# Patient Record
Sex: Female | Born: 1945 | Race: White | Hispanic: No | Marital: Single | Smoking: Former smoker
Health system: Southern US, Community
[De-identification: ages and names within clinical notes are randomized; demographics above are authoritative.]

## PROBLEM LIST (undated history)

## (undated) DIAGNOSIS — K219 Gastro-esophageal reflux disease without esophagitis: Secondary | ICD-10-CM

## (undated) DIAGNOSIS — M797 Fibromyalgia: Secondary | ICD-10-CM

## (undated) DIAGNOSIS — M1990 Unspecified osteoarthritis, unspecified site: Secondary | ICD-10-CM

## (undated) DIAGNOSIS — I10 Essential (primary) hypertension: Secondary | ICD-10-CM

## (undated) DIAGNOSIS — K589 Irritable bowel syndrome without diarrhea: Secondary | ICD-10-CM

---
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
## 2010-12-18 ENCOUNTER — Observation Stay: Payer: Self-pay | Admitting: Internal Medicine

## 2012-06-29 ENCOUNTER — Ambulatory Visit: Payer: Self-pay | Admitting: Family Medicine

## 2012-09-29 ENCOUNTER — Ambulatory Visit: Payer: Self-pay | Admitting: Gastroenterology

## 2012-10-03 LAB — PATHOLOGY REPORT

## 2013-02-05 ENCOUNTER — Ambulatory Visit: Payer: Self-pay | Admitting: Family Medicine

## 2013-02-20 ENCOUNTER — Ambulatory Visit: Payer: Self-pay | Admitting: Family Medicine

## 2013-05-15 ENCOUNTER — Ambulatory Visit: Payer: Self-pay | Admitting: Family Medicine

## 2014-03-18 DIAGNOSIS — J302 Other seasonal allergic rhinitis: Secondary | ICD-10-CM | POA: Insufficient documentation

## 2015-07-29 ENCOUNTER — Other Ambulatory Visit: Payer: Self-pay | Admitting: Family Medicine

## 2015-07-29 DIAGNOSIS — Z1231 Encounter for screening mammogram for malignant neoplasm of breast: Secondary | ICD-10-CM

## 2015-08-13 ENCOUNTER — Ambulatory Visit
Admission: RE | Admit: 2015-08-13 | Discharge: 2015-08-13 | Disposition: A | Discharge status: Home / Self Care | Payer: Medicare Other | Source: Ambulatory Visit | Attending: Family Medicine | Admitting: Family Medicine

## 2015-08-13 DIAGNOSIS — Z1231 Encounter for screening mammogram for malignant neoplasm of breast: Secondary | ICD-10-CM | POA: Insufficient documentation

## 2016-05-10 DIAGNOSIS — M2550 Pain in unspecified joint: Secondary | ICD-10-CM | POA: Insufficient documentation

## 2016-05-10 DIAGNOSIS — R768 Other specified abnormal immunological findings in serum: Secondary | ICD-10-CM | POA: Insufficient documentation

## 2016-06-17 DIAGNOSIS — M7552 Bursitis of left shoulder: Secondary | ICD-10-CM | POA: Insufficient documentation

## 2017-04-20 DIAGNOSIS — E669 Obesity, unspecified: Secondary | ICD-10-CM | POA: Insufficient documentation

## 2017-04-21 ENCOUNTER — Other Ambulatory Visit: Payer: Self-pay | Admitting: Orthopedic Surgery

## 2017-04-21 DIAGNOSIS — M25562 Pain in left knee: Principal | ICD-10-CM

## 2017-04-21 DIAGNOSIS — M2392 Unspecified internal derangement of left knee: Secondary | ICD-10-CM

## 2017-04-21 DIAGNOSIS — G8929 Other chronic pain: Secondary | ICD-10-CM

## 2017-04-21 DIAGNOSIS — M2352 Chronic instability of knee, left knee: Secondary | ICD-10-CM

## 2017-05-04 ENCOUNTER — Ambulatory Visit
Admission: RE | Admit: 2017-05-04 | Discharge: 2017-05-04 | Disposition: A | Discharge status: Home / Self Care | Payer: Medicare Other | Source: Ambulatory Visit | Attending: Orthopedic Surgery | Admitting: Orthopedic Surgery

## 2017-05-04 DIAGNOSIS — M948X6 Other specified disorders of cartilage, lower leg: Secondary | ICD-10-CM | POA: Diagnosis not present

## 2017-05-04 DIAGNOSIS — M25562 Pain in left knee: Secondary | ICD-10-CM | POA: Diagnosis present

## 2017-05-04 DIAGNOSIS — M2352 Chronic instability of knee, left knee: Secondary | ICD-10-CM | POA: Diagnosis not present

## 2017-05-04 DIAGNOSIS — M2392 Unspecified internal derangement of left knee: Secondary | ICD-10-CM | POA: Diagnosis present

## 2017-05-04 DIAGNOSIS — G8929 Other chronic pain: Secondary | ICD-10-CM

## 2017-05-18 DIAGNOSIS — M23204 Derangement of unspecified medial meniscus due to old tear or injury, left knee: Secondary | ICD-10-CM | POA: Insufficient documentation

## 2017-06-01 DIAGNOSIS — M7582 Other shoulder lesions, left shoulder: Secondary | ICD-10-CM | POA: Insufficient documentation

## 2017-06-08 ENCOUNTER — Other Ambulatory Visit: Payer: Self-pay | Admitting: Surgery

## 2017-06-08 DIAGNOSIS — M7582 Other shoulder lesions, left shoulder: Secondary | ICD-10-CM

## 2017-06-15 ENCOUNTER — Ambulatory Visit
Admission: RE | Admit: 2017-06-15 | Discharge: 2017-06-15 | Disposition: A | Discharge status: Home / Self Care | Payer: Medicare Other | Source: Ambulatory Visit | Attending: Surgery | Admitting: Surgery

## 2017-06-15 DIAGNOSIS — M7552 Bursitis of left shoulder: Secondary | ICD-10-CM | POA: Insufficient documentation

## 2017-06-15 DIAGNOSIS — M75102 Unspecified rotator cuff tear or rupture of left shoulder, not specified as traumatic: Secondary | ICD-10-CM | POA: Insufficient documentation

## 2017-06-15 DIAGNOSIS — M7582 Other shoulder lesions, left shoulder: Secondary | ICD-10-CM | POA: Diagnosis present

## 2017-06-22 DIAGNOSIS — M75112 Incomplete rotator cuff tear or rupture of left shoulder, not specified as traumatic: Secondary | ICD-10-CM | POA: Insufficient documentation

## 2017-11-17 ENCOUNTER — Other Ambulatory Visit: Payer: Self-pay | Admitting: Family Medicine

## 2017-11-17 DIAGNOSIS — R1013 Epigastric pain: Secondary | ICD-10-CM

## 2017-11-22 ENCOUNTER — Ambulatory Visit
Admission: RE | Admit: 2017-11-22 | Discharge: 2017-11-22 | Disposition: A | Discharge status: Home / Self Care | Payer: Medicare Other | Source: Ambulatory Visit | Attending: Family Medicine | Admitting: Family Medicine

## 2017-11-22 DIAGNOSIS — K8020 Calculus of gallbladder without cholecystitis without obstruction: Secondary | ICD-10-CM | POA: Insufficient documentation

## 2017-11-22 DIAGNOSIS — R1013 Epigastric pain: Secondary | ICD-10-CM | POA: Insufficient documentation

## 2017-12-07 ENCOUNTER — Other Ambulatory Visit: Payer: Self-pay | Admitting: Surgery

## 2017-12-07 DIAGNOSIS — K838 Other specified diseases of biliary tract: Secondary | ICD-10-CM

## 2017-12-17 ENCOUNTER — Other Ambulatory Visit: Payer: Self-pay | Admitting: Surgery

## 2017-12-17 ENCOUNTER — Ambulatory Visit: Payer: Medicare Other

## 2017-12-17 ENCOUNTER — Ambulatory Visit
Admission: RE | Admit: 2017-12-17 | Discharge: 2017-12-17 | Disposition: A | Discharge status: Home / Self Care | Payer: Medicare Other | Source: Ambulatory Visit | Attending: Surgery | Admitting: Surgery

## 2017-12-17 DIAGNOSIS — K838 Other specified diseases of biliary tract: Secondary | ICD-10-CM

## 2017-12-17 DIAGNOSIS — K449 Diaphragmatic hernia without obstruction or gangrene: Secondary | ICD-10-CM | POA: Insufficient documentation

## 2017-12-17 DIAGNOSIS — K760 Fatty (change of) liver, not elsewhere classified: Secondary | ICD-10-CM | POA: Diagnosis not present

## 2017-12-17 DIAGNOSIS — K8020 Calculus of gallbladder without cholecystitis without obstruction: Secondary | ICD-10-CM | POA: Insufficient documentation

## 2017-12-17 MED ORDER — GADOBUTROL 1 MMOL/ML IV SOLN
8.0000 mL | Freq: Once | INTRAVENOUS | Status: AC | PRN
  Administered 2017-12-17: 8 mL via INTRAVENOUS

## 2018-01-06 ENCOUNTER — Other Ambulatory Visit: Payer: Self-pay

## 2018-01-06 ENCOUNTER — Ambulatory Visit: Payer: Self-pay | Admitting: Surgery

## 2018-01-06 ENCOUNTER — Encounter
Admission: RE | Admit: 2018-01-06 | Discharge: 2018-01-06 | Disposition: A | Discharge status: Home / Self Care | Payer: Medicare Other | Source: Ambulatory Visit | Attending: Surgery | Admitting: Surgery

## 2018-01-06 DIAGNOSIS — Z01810 Encounter for preprocedural cardiovascular examination: Secondary | ICD-10-CM | POA: Diagnosis present

## 2018-01-06 DIAGNOSIS — I498 Other specified cardiac arrhythmias: Secondary | ICD-10-CM | POA: Diagnosis not present

## 2018-01-06 DIAGNOSIS — R9431 Abnormal electrocardiogram [ECG] [EKG]: Secondary | ICD-10-CM | POA: Diagnosis not present

## 2018-01-06 DIAGNOSIS — I10 Essential (primary) hypertension: Secondary | ICD-10-CM | POA: Insufficient documentation

## 2018-01-06 HISTORY — DX: Essential (primary) hypertension: I10

## 2018-01-06 HISTORY — DX: Gastro-esophageal reflux disease without esophagitis: K21.9

## 2018-01-06 NOTE — H&P (View-Only) (Signed)
Subjective:   CC: Common bile duct dilatation [K83.8]  HPI:  Tamara Lawrence is a 72 y.o. female who was referred by Heron Nay * for evaluation of above CC. Symptoms were first noted 3 months ago. Pain is dull and intermittent, confined to the RUQ, without radiation.  Associated with intermittent nausea, exacerbated by nothing specific. Hx of diverticulitis but mostly in sigmoid, this feels different.     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  Past Surgical History:  has a past surgical history that includes Tonsillectomy.  Family History: family history includes Bipolar disorder in her cousin and paternal grandmother; Colon cancer in her mother; Colon polyps in her sister; Coronary Artery Disease (Blocked arteries around heart) in her father; Depression in her daughter and daughter; High blood pressure (Hypertension) in her father and mother; Skin cancer in her sister.  Social History:  reports that she quit smoking about 28 years ago. She smoked 1.00 pack per day. She has never used smokeless tobacco. She reports that she drinks alcohol. She reports that she does not use drugs.  Current Medications: has a current medication list which includes the following prescription(s): cetirizine, ibuprofen, lisinopril, omeprazole, sertraline, and sertraline.  Allergies:     Allergies as of 12/07/2017  . (No Known Allergies)    ROS:  A 15 point review of systems was performed and pertinent positives and negatives noted in HPI    Objective:   BP 133/76   Pulse 82   Temp 36.5 C (97.7 F) (Oral)   Ht 154.9 cm (5'  1")   Wt 79.2 kg (174 lb 9.6 oz)   BMI 32.99 kg/m    Constitutional :  alert, appears stated age, cooperative and no distress  Lymphatics/Throat:  no asymmetry, masses, or scars  Respiratory:  clear to auscultation bilaterally  Cardiovascular:  regular rate and rhythm  Gastrointestinal: soft, non-tender; bowel sounds normal; no masses,  no organomegaly.    Musculoskeletal: Steady gait and movement  Skin: Cool and moist  Psychiatric: Normal affect, non-agitated, not confused       LABS:  -      Lab Results  Component Value Date   WBC 8.2 11/16/2017   HGB 13.1 11/16/2017   HCT 39.8 11/16/2017   PLT 284 11/16/2017   -      Lab Results  Component Value Date   NA 139 11/16/2017   K 4.0 11/16/2017   CL 102 11/16/2017   CO2 24.5 11/16/2017   BUN 15 11/16/2017   CREATININE 0.8 11/16/2017   GLUCOSE 106 11/16/2017   -      Lab Results  Component Value Date   NA 139 11/16/2017   K 4.0 11/16/2017   CL 102 11/16/2017   CO2 24.5 11/16/2017   BUN 15 11/16/2017   CREATININE 0.8 11/16/2017   CALCIUM 9.5 11/16/2017   TP 7.0 07/21/2011   ALB 4.2 11/22/2017   TBILI 0.5 11/22/2017   ALKPHOS 120 (H) 11/22/2017   AST 17 11/22/2017   ALT 24 11/22/2017   GLUCOSE 106 11/16/2017   GFR 71 11/16/2017     RADS: CLINICAL DATA: Right upper quadrant and epigastric pain  EXAM: ULTRASOUND ABDOMEN LIMITED RIGHT UPPER QUADRANT  COMPARISON: None.  FINDINGS: Gallbladder:  Shadowing non mobile  stone in the gallbladder neck measuring 1.9 cm. Normal wall thickness. Negative sonographic Murphy.  Common bile duct:  Diameter: Enlarged at 9.2 mm  Liver:  Increased hepatic echogenicity. Septated cyst measuring 1.2 cm in the left lobe. Incidental note made of right upper pole renal cyst portal vein is patent on color Doppler imaging with normal direction of blood flow towards the liver.  IMPRESSION: 1. Cholelithiasis without definite  sonographic evidence for cholecystitis. 2. Enlarged common bile duct, measuring up to 9.2 mm. Recommend correlation with LFT, and possible further evaluation with MRCP if ductal obstruction is suspected. 3. Echogenic liver suggesting steatosis   Electronically Signed By: Donavan Foil M.D. On: 11/22/2017 14:36 Assessment:      Common bile duct dilatation [K83.8]  Plan:   1. Common bile duct dilatation [K83.8] Proceed with MRCP due to dilated CBD, prior to lap chole for history consistent with biliary colic.  Will call patient with results once available then discuss r/b/a for possible lap chole.  Update: Discussed the risk of surgery including post-op infxn, seroma, biloma, chronic pain, poor-delayed wound healing, retained gallstone, conversion to open procedure, post-op SBO or ileus, and need for additional procedures to address said risks.  The risks of general anesthetic including MI, CVA, sudden death or even reaction to anesthetic medications also discussed. Alternatives include continued observation.  Benefits include possible symptom relief, prevention of complications including acute cholecystitis, pancreatitis.  Typical post operative recovery of 3-5 days rest, continued pain in area and incision sites, possible loose stools up to 4-6 weeks, also discussed.  ED return precautions given for sudden increase in RUQ pain, with possible accompanying fever, nausea, and/or vomiting.  The patient understands the risks, any and all questions were answered to the patient's satisfaction.  Will proceed with surgery

## 2018-01-06 NOTE — H&P (Signed)
Subjective:   CC: Common bile duct dilatation [K83.8]  HPI:  Tamara Lawrence is a 72 y.o. female who was referred by Heron Nay * for evaluation of above CC. Symptoms were first noted 3 months ago. Pain is dull and intermittent, confined to the RUQ, without radiation.  Associated with intermittent nausea, exacerbated by nothing specific. Hx of diverticulitis but mostly in sigmoid, this feels different.     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  Past Surgical History:  has a past surgical history that includes Tonsillectomy.  Family History: family history includes Bipolar disorder in her cousin and paternal grandmother; Colon cancer in her mother; Colon polyps in her sister; Coronary Artery Disease (Blocked arteries around heart) in her father; Depression in her daughter and daughter; High blood pressure (Hypertension) in her father and mother; Skin cancer in her sister.  Social History:  reports that she quit smoking about 28 years ago. She smoked 1.00 pack per day. She has never used smokeless tobacco. She reports that she drinks alcohol. She reports that she does not use drugs.  Current Medications: has a current medication list which includes the following prescription(s): cetirizine, ibuprofen, lisinopril, omeprazole, sertraline, and sertraline.  Allergies:     Allergies as of 12/07/2017  . (No Known Allergies)    ROS:  A 15 point review of systems was performed and pertinent positives and negatives noted in HPI    Objective:   BP 133/76   Pulse 82   Temp 36.5 C (97.7 F) (Oral)   Ht 154.9 cm (5'  1")   Wt 79.2 kg (174 lb 9.6 oz)   BMI 32.99 kg/m    Constitutional :  alert, appears stated age, cooperative and no distress  Lymphatics/Throat:  no asymmetry, masses, or scars  Respiratory:  clear to auscultation bilaterally  Cardiovascular:  regular rate and rhythm  Gastrointestinal: soft, non-tender; bowel sounds normal; no masses,  no organomegaly.    Musculoskeletal: Steady gait and movement  Skin: Cool and moist  Psychiatric: Normal affect, non-agitated, not confused       LABS:  -      Lab Results  Component Value Date   WBC 8.2 11/16/2017   HGB 13.1 11/16/2017   HCT 39.8 11/16/2017   PLT 284 11/16/2017   -      Lab Results  Component Value Date   NA 139 11/16/2017   K 4.0 11/16/2017   CL 102 11/16/2017   CO2 24.5 11/16/2017   BUN 15 11/16/2017   CREATININE 0.8 11/16/2017   GLUCOSE 106 11/16/2017   -      Lab Results  Component Value Date   NA 139 11/16/2017   K 4.0 11/16/2017   CL 102 11/16/2017   CO2 24.5 11/16/2017   BUN 15 11/16/2017   CREATININE 0.8 11/16/2017   CALCIUM 9.5 11/16/2017   TP 7.0 07/21/2011   ALB 4.2 11/22/2017   TBILI 0.5 11/22/2017   ALKPHOS 120 (H) 11/22/2017   AST 17 11/22/2017   ALT 24 11/22/2017   GLUCOSE 106 11/16/2017   GFR 71 11/16/2017     RADS: CLINICAL DATA: Right upper quadrant and epigastric pain  EXAM: ULTRASOUND ABDOMEN LIMITED RIGHT UPPER QUADRANT  COMPARISON: None.  FINDINGS: Gallbladder:  Shadowing non mobile  stone in the gallbladder neck measuring 1.9 cm. Normal wall thickness. Negative sonographic Murphy.  Common bile duct:  Diameter: Enlarged at 9.2 mm  Liver:  Increased hepatic echogenicity. Septated cyst measuring 1.2 cm in the left lobe. Incidental note made of right upper pole renal cyst portal vein is patent on color Doppler imaging with normal direction of blood flow towards the liver.  IMPRESSION: 1. Cholelithiasis without definite  sonographic evidence for cholecystitis. 2. Enlarged common bile duct, measuring up to 9.2 mm. Recommend correlation with LFT, and possible further evaluation with MRCP if ductal obstruction is suspected. 3. Echogenic liver suggesting steatosis   Electronically Signed By: Donavan Foil M.D. On: 11/22/2017 14:36 Assessment:      Common bile duct dilatation [K83.8]  Plan:   1. Common bile duct dilatation [K83.8] Proceed with MRCP due to dilated CBD, prior to lap chole for history consistent with biliary colic.  Will call patient with results once available then discuss r/b/a for possible lap chole.  Update: Discussed the risk of surgery including post-op infxn, seroma, biloma, chronic pain, poor-delayed wound healing, retained gallstone, conversion to open procedure, post-op SBO or ileus, and need for additional procedures to address said risks.  The risks of general anesthetic including MI, CVA, sudden death or even reaction to anesthetic medications also discussed. Alternatives include continued observation.  Benefits include possible symptom relief, prevention of complications including acute cholecystitis, pancreatitis.  Typical post operative recovery of 3-5 days rest, continued pain in area and incision sites, possible loose stools up to 4-6 weeks, also discussed.  ED return precautions given for sudden increase in RUQ pain, with possible accompanying fever, nausea, and/or vomiting.  The patient understands the risks, any and all questions were answered to the patient's satisfaction.  Will proceed with surgery

## 2018-01-06 NOTE — Patient Instructions (Signed)
  Your procedure is scheduled on: Tuesday January 10, 2018 Report to Same Day Surgery 2nd floor [facility] Entrance-take elevator on left to 2nd floor.  Check in with surgery information desk.) To find out your arrival time, call [phone] 1:00-3:00 PM on Monday January 09, 2018  Remember: Instructions that are not followed completely may result in serious medical risk, up to and including death, or upon the discretion of your surgeon and anesthesiologist your surgery may need to be rescheduled.    __x__ 1. Do not eat food (including mints, candies, chewing gum) after midnight the night before your procedure. You may drink clear liquids up to 2 hours before you are scheduled to arrive at the hospital for your procedure.  Do not drink anything within 2 hours of your scheduled arrival to the hospital.  Approved clear liquids:  --Water or Apple juice without pulp  --Clear carbohydrate beverage such as Gatorade or Powerade  --Black Coffee or Clear Tea (No milk, no creamers, do not add anything to the coffee or tea)    __x__ 2. No Alcohol for 24 hours before or after surgery.   __x__ 3. No Smoking or e-cigarettes for 24 hours before surgery.  Do not use any chewable tobacco products for at least 6 hours before surgery.   __x__ 4. Notify your doctor if there is any change in your medical condition (cold, fever, infections)   __x__ 5. On the morning of surgery brush your teeth with toothpaste and water.  You may rinse your mouth with mouthwash if you wish.  Do not swallow any toothpaste or mouthwash.  Please read over the following fact sheets that you were given:   [facility] Preparing for Surgery and/or MRSA Information    __x__ Use CHG Soap or Sage wipes as directed on instruction sheet   Do not wear jewelry, make-up, hairpins, clips or nail polish on the day of surgery.  Do not wear lotions, powders, deodorant, or perfumes.   Do not shave below the face/neck 48 hours  prior to surgery.   Do not bring valuables to the hospital.    [facility] is not responsible for any belongings or valuables.               Contacts, dentures or bridgework may not be worn into surgery.  For patients discharged on the day of surgery, you will NOT be permitted to drive yourself home.  You must have a responsible adult with you for 24 hours after surgery.  __x__ Take with a SMALL SIP OF WATER on the morning of surgery. These include:  1. Omeprazole (Prilosec)  __x__ Follow recommendations from Cardiologist, Pulmonologist or PCP regarding stopping Aspirin, Coumadin, Plavix, Eliquis, Effient, Pradaxa, and Pletal.  __x__  Stop Anti-inflammatories such as Advil, Ibuprofen, Motrin, Aleve, Naproxen, Naprosyn, BC/Goodies powders or aspirin products. You may continue to take Tylenol and Celebrex.   __x__  Stop supplements until after surgery. You may continue to take Vitamin D, Vitamin B, and multivitamin.

## 2018-01-09 MED ORDER — CEFAZOLIN SODIUM-DEXTROSE 2-4 GM/100ML-% IV SOLN
2.0000 g | INTRAVENOUS | Status: AC
  Administered 2018-01-10: 2 g via INTRAVENOUS

## 2018-01-10 ENCOUNTER — Other Ambulatory Visit: Payer: Self-pay

## 2018-01-10 ENCOUNTER — Ambulatory Visit: Payer: Medicare Other | Admitting: Anesthesiology

## 2018-01-10 ENCOUNTER — Ambulatory Visit
Admission: RE | Admit: 2018-01-10 | Discharge: 2018-01-10 | Disposition: A | Discharge status: Home / Self Care | Payer: Medicare Other | Source: Ambulatory Visit | Attending: Surgery | Admitting: Surgery

## 2018-01-10 ENCOUNTER — Encounter
Admission: RE | Disposition: A | Discharge status: Home / Self Care | Payer: Self-pay | Source: Ambulatory Visit | Attending: Surgery

## 2018-01-10 ENCOUNTER — Encounter: Payer: Self-pay | Admitting: Emergency Medicine

## 2018-01-10 DIAGNOSIS — K8010 Calculus of gallbladder with chronic cholecystitis without obstruction: Secondary | ICD-10-CM | POA: Diagnosis not present

## 2018-01-10 DIAGNOSIS — Z87891 Personal history of nicotine dependence: Secondary | ICD-10-CM | POA: Insufficient documentation

## 2018-01-10 DIAGNOSIS — I10 Essential (primary) hypertension: Secondary | ICD-10-CM | POA: Diagnosis not present

## 2018-01-10 DIAGNOSIS — Z79899 Other long term (current) drug therapy: Secondary | ICD-10-CM | POA: Insufficient documentation

## 2018-01-10 DIAGNOSIS — K219 Gastro-esophageal reflux disease without esophagitis: Secondary | ICD-10-CM | POA: Diagnosis not present

## 2018-01-10 DIAGNOSIS — K810 Acute cholecystitis: Secondary | ICD-10-CM

## 2018-01-10 DIAGNOSIS — F329 Major depressive disorder, single episode, unspecified: Secondary | ICD-10-CM | POA: Diagnosis not present

## 2018-01-10 HISTORY — PX: CHOLECYSTECTOMY: SHX55

## 2018-01-10 SURGERY — LAPAROSCOPIC CHOLECYSTECTOMY
Anesthesia: General | Site: Abdomen

## 2018-01-10 MED ORDER — SUGAMMADEX SODIUM 200 MG/2ML IV SOLN
INTRAVENOUS | Status: DC | PRN
  Administered 2018-01-10: 200 mg via INTRAVENOUS

## 2018-01-10 MED ORDER — ACETAMINOPHEN 325 MG PO TABS: 650.0000 mg | ORAL_TABLET | Freq: Three times a day (TID) | ORAL | 0 refills | Status: AC | PRN

## 2018-01-10 MED ORDER — FENTANYL CITRATE (PF) 100 MCG/2ML IJ SOLN
INTRAMUSCULAR | Status: AC
  Filled 2018-01-10: qty 2

## 2018-01-10 MED ORDER — LIDOCAINE-EPINEPHRINE 1 %-1:100000 IJ SOLN
INTRAMUSCULAR | Status: AC
  Filled 2018-01-10: qty 1

## 2018-01-10 MED ORDER — PROPOFOL 10 MG/ML IV BOLUS
INTRAVENOUS | Status: AC
  Filled 2018-01-10: qty 20

## 2018-01-10 MED ORDER — HYDROCODONE-ACETAMINOPHEN 5-325 MG PO TABS: 1.0000 | ORAL_TABLET | Freq: Four times a day (QID) | ORAL | 0 refills | Status: AC | PRN

## 2018-01-10 MED ORDER — IBUPROFEN 800 MG PO TABS: 800.0000 mg | ORAL_TABLET | Freq: Three times a day (TID) | ORAL | 0 refills | Status: DC | PRN

## 2018-01-10 MED ORDER — LIDOCAINE-EPINEPHRINE 1 %-1:100000 IJ SOLN
INTRAMUSCULAR | Status: DC | PRN
  Administered 2018-01-10: 8 mL

## 2018-01-10 MED ORDER — PHENYLEPHRINE HCL 10 MG/ML IJ SOLN
INTRAMUSCULAR | Status: DC | PRN
  Administered 2018-01-10 (×2): 100 ug via INTRAVENOUS
  Administered 2018-01-10: 200 ug via INTRAVENOUS
  Administered 2018-01-10: 100 ug via INTRAVENOUS

## 2018-01-10 MED ORDER — FENTANYL CITRATE (PF) 100 MCG/2ML IJ SOLN
25.0000 ug | INTRAMUSCULAR | Status: AC | PRN
  Administered 2018-01-10 (×6): 25 ug via INTRAVENOUS

## 2018-01-10 MED ORDER — LIDOCAINE HCL (CARDIAC) PF 100 MG/5ML IV SOSY
PREFILLED_SYRINGE | INTRAVENOUS | Status: DC | PRN
  Administered 2018-01-10: 100 mg via INTRAVENOUS

## 2018-01-10 MED ORDER — CEFAZOLIN SODIUM-DEXTROSE 2-4 GM/100ML-% IV SOLN
INTRAVENOUS | Status: AC
  Filled 2018-01-10: qty 100

## 2018-01-10 MED ORDER — FENTANYL CITRATE (PF) 100 MCG/2ML IJ SOLN
INTRAMUSCULAR | Status: AC
  Administered 2018-01-10: 25 ug via INTRAVENOUS
  Filled 2018-01-10: qty 2

## 2018-01-10 MED ORDER — CHLORHEXIDINE GLUCONATE CLOTH 2 % EX PADS: 6.0000 | MEDICATED_PAD | Freq: Once | CUTANEOUS | Status: DC

## 2018-01-10 MED ORDER — PROPOFOL 10 MG/ML IV BOLUS
INTRAVENOUS | Status: DC | PRN
  Administered 2018-01-10: 150 mg via INTRAVENOUS

## 2018-01-10 MED ORDER — FENTANYL CITRATE (PF) 100 MCG/2ML IJ SOLN
INTRAMUSCULAR | Status: DC | PRN
  Administered 2018-01-10 (×2): 50 ug via INTRAVENOUS

## 2018-01-10 MED ORDER — MIDAZOLAM HCL 2 MG/2ML IJ SOLN
INTRAMUSCULAR | Status: DC | PRN
  Administered 2018-01-10: 2 mg via INTRAVENOUS

## 2018-01-10 MED ORDER — DOCUSATE SODIUM 100 MG PO CAPS: 100.0000 mg | ORAL_CAPSULE | Freq: Two times a day (BID) | ORAL | 0 refills | Status: AC | PRN

## 2018-01-10 MED ORDER — SODIUM CHLORIDE 0.9 % IV SOLN
INTRAVENOUS | Status: DC | PRN
  Administered 2018-01-10: 35 ug/min via INTRAVENOUS

## 2018-01-10 MED ORDER — DEXAMETHASONE SODIUM PHOSPHATE 10 MG/ML IJ SOLN
INTRAMUSCULAR | Status: DC | PRN
  Administered 2018-01-10: 10 mg via INTRAVENOUS

## 2018-01-10 MED ORDER — LACTATED RINGERS IV SOLN
INTRAVENOUS | Status: DC
  Administered 2018-01-10: 08:00:00 via INTRAVENOUS

## 2018-01-10 MED ORDER — BUPIVACAINE HCL (PF) 0.5 % IJ SOLN
INTRAMUSCULAR | Status: DC | PRN
  Administered 2018-01-10: 8 mL

## 2018-01-10 MED ORDER — ONDANSETRON HCL 4 MG/2ML IJ SOLN: 4.0000 mg | Freq: Once | INTRAMUSCULAR | Status: DC | PRN

## 2018-01-10 MED ORDER — GLYCOPYRROLATE 0.2 MG/ML IJ SOLN
INTRAMUSCULAR | Status: DC | PRN
  Administered 2018-01-10: 0.2 mg via INTRAVENOUS

## 2018-01-10 MED ORDER — BUPIVACAINE HCL (PF) 0.5 % IJ SOLN
INTRAMUSCULAR | Status: AC
  Filled 2018-01-10: qty 30

## 2018-01-10 MED ORDER — ONDANSETRON HCL 4 MG/2ML IJ SOLN
INTRAMUSCULAR | Status: DC | PRN
  Administered 2018-01-10: 4 mg via INTRAVENOUS

## 2018-01-10 MED ORDER — ROCURONIUM BROMIDE 100 MG/10ML IV SOLN
INTRAVENOUS | Status: DC | PRN
  Administered 2018-01-10: 40 mg via INTRAVENOUS

## 2018-01-10 SURGICAL SUPPLY — 58 items
APPLICATOR COTTON TIP 6 STRL (MISCELLANEOUS) IMPLANT
APPLICATOR COTTON TIP 6IN STRL (MISCELLANEOUS)
APPLIER CLIP 5 13 M/L LIGAMAX5 (MISCELLANEOUS) ×3
BLADE SURG SZ11 CARB STEEL (BLADE) ×3 IMPLANT
CANISTER SUCT 1200ML W/VALVE (MISCELLANEOUS) ×3 IMPLANT
CHLORAPREP W/TINT 26ML (MISCELLANEOUS) ×3 IMPLANT
CHOLANGIOGRAM CATH TAUT (CATHETERS) IMPLANT
CLIP APPLIE 5 13 M/L LIGAMAX5 (MISCELLANEOUS) ×1 IMPLANT
COVER WAND RF STERILE (DRAPES) ×3 IMPLANT
DECANTER SPIKE VIAL GLASS SM (MISCELLANEOUS) ×6 IMPLANT
DEFOGGER SCOPE WARMER CLEARIFY (MISCELLANEOUS) IMPLANT
DERMABOND ADVANCED (GAUZE/BANDAGES/DRESSINGS) ×2
DERMABOND ADVANCED .7 DNX12 (GAUZE/BANDAGES/DRESSINGS) ×1 IMPLANT
DISSECTOR BLUNT TIP ENDO 5MM (MISCELLANEOUS) IMPLANT
DISSECTOR KITTNER STICK (MISCELLANEOUS) IMPLANT
DISSECTORS/KITTNER STICK (MISCELLANEOUS)
DRAPE C-ARM XRAY 36X54 (DRAPES) IMPLANT
DRAPE SHEET LG 3/4 BI-LAMINATE (DRAPES) IMPLANT
ELECT CAUTERY BLADE 6.4 (BLADE) IMPLANT
ELECT REM PT RETURN 9FT ADLT (ELECTROSURGICAL) ×3
ELECTRODE REM PT RTRN 9FT ADLT (ELECTROSURGICAL) ×1 IMPLANT
ENDOLOOP SUT PDS II  0 18 (SUTURE) ×4
ENDOLOOP SUT PDS II 0 18 (SUTURE) ×2 IMPLANT
GLOVE BIOGEL PI IND STRL 7.0 (GLOVE) ×1 IMPLANT
GLOVE BIOGEL PI INDICATOR 7.0 (GLOVE) ×2
GLOVE SURG SYN 7.0 (GLOVE) ×3 IMPLANT
GOWN STRL REUS W/ TWL LRG LVL3 (GOWN DISPOSABLE) ×3 IMPLANT
GOWN STRL REUS W/TWL LRG LVL3 (GOWN DISPOSABLE) ×6
GRASPER SUT TROCAR 14GX15 (MISCELLANEOUS) ×3 IMPLANT
IRRIGATION STRYKERFLOW (MISCELLANEOUS) ×1 IMPLANT
IRRIGATOR STRYKERFLOW (MISCELLANEOUS) ×3
IV CATH ANGIO 12GX3 LT BLUE (NEEDLE) IMPLANT
IV NS 1000ML (IV SOLUTION) ×2
IV NS 1000ML BAXH (IV SOLUTION) ×1 IMPLANT
JACKSON PRATT 10 (INSTRUMENTS) IMPLANT
L-HOOK LAP DISP 36CM (ELECTROSURGICAL) ×3
LHOOK LAP DISP 36CM (ELECTROSURGICAL) ×1 IMPLANT
NEEDLE HYPO 22GX1.5 SAFETY (NEEDLE) ×3 IMPLANT
PACK LAP CHOLECYSTECTOMY (MISCELLANEOUS) ×3 IMPLANT
PENCIL ELECTRO HAND CTR (MISCELLANEOUS) ×3 IMPLANT
PORT ACCESS TROCAR AIRSEAL 5 (TROCAR) ×3 IMPLANT
POUCH SPECIMEN RETRIEVAL 10MM (ENDOMECHANICALS) ×3 IMPLANT
SCISSORS METZENBAUM CVD 33 (INSTRUMENTS) ×3 IMPLANT
SET TRI-LUMEN FLTR TB AIRSEAL (TUBING) ×3 IMPLANT
SLEEVE ENDOPATH XCEL 5M (ENDOMECHANICALS) ×3 IMPLANT
SPONGE LAP 18X18 RF (DISPOSABLE) IMPLANT
STOPCOCK 4 WAY LG BORE MALE ST (IV SETS) IMPLANT
SUT MNCRL 4-0 (SUTURE) ×2
SUT MNCRL 4-0 27XMFL (SUTURE) ×1
SUT VIC AB 3-0 SH 27 (SUTURE)
SUT VIC AB 3-0 SH 27X BRD (SUTURE) IMPLANT
SUT VICRYL 0 AB UR-6 (SUTURE) ×6 IMPLANT
SUTURE MNCRL 4-0 27XMF (SUTURE) ×1 IMPLANT
SYR 20CC LL (SYRINGE) ×3 IMPLANT
TOWEL OR 17X26 4PK STRL BLUE (TOWEL DISPOSABLE) ×3 IMPLANT
TROCAR XCEL BLUNT TIP 100MML (ENDOMECHANICALS) ×3 IMPLANT
TROCAR XCEL NON-BLD 5MMX100MML (ENDOMECHANICALS) ×3 IMPLANT
WATER STERILE IRR 1000ML POUR (IV SOLUTION) ×3 IMPLANT

## 2018-01-10 NOTE — Interval H&P Note (Signed)
History and Physical Interval Note:  01/10/2018 8:14 AM  Tamara Lawrence  has presented today for surgery, with the diagnosis of biliary colic  The various methods of treatment have been discussed with the patient and family. After consideration of risks, benefits and other options for treatment, the patient has consented to  Procedure(s): LAPAROSCOPIC CHOLECYSTECTOMY (N/A) as a surgical intervention .  The patient's history has been reviewed, patient examined, no change in status, stable for surgery.  I have reviewed the patient's chart and labs.  Questions were answered to the patient's satisfaction.     Tasnia Spegal Lysle Pearl

## 2018-01-10 NOTE — Discharge Instructions (Signed)

## 2018-01-10 NOTE — Anesthesia Procedure Notes (Signed)
Procedure Name: Intubation Date/Time: 01/10/2018 8:49 AM Performed by: Philbert Riser, CRNA Pre-anesthesia Checklist: Patient identified, Emergency Drugs available, Suction available, Patient being monitored and Timeout performed Patient Re-evaluated:Patient Re-evaluated prior to induction Oxygen Delivery Method: Circle system utilized and Simple face mask Preoxygenation: Pre-oxygenation with 100% oxygen Induction Type: IV induction Ventilation: Mask ventilation without difficulty Laryngoscope Size: Mac and 3 Grade View: Grade I Tube type: Oral Tube size: 7.0 mm Number of attempts: 2 Airway Equipment and Method: Stylet Placement Confirmation: ETT inserted through vocal cords under direct vision,  positive ETCO2 and breath sounds checked- equal and bilateral Secured at: 21 cm Tube secured with: Tape Dental Injury: Teeth and Oropharynx as per pre-operative assessment

## 2018-01-10 NOTE — Anesthesia Postprocedure Evaluation (Signed)
Anesthesia Post Note  Patient: Tamara Lawrence  Procedure(s) Performed: LAPAROSCOPIC CHOLECYSTECTOMY (N/A Abdomen)  Patient location during evaluation: PACU Anesthesia Type: General Level of consciousness: awake and alert Pain management: pain level controlled Vital Signs Assessment: post-procedure vital signs reviewed and stable Respiratory status: spontaneous breathing and respiratory function stable Cardiovascular status: stable Anesthetic complications: no     Last Vitals:  Vitals:   01/10/18 1017 01/10/18 1026  BP: 125/60   Pulse: 81 82  Resp: 10 18  Temp: 37.1 C   SpO2: 100% 100%    Last Pain:  Vitals:   01/10/18 1026  TempSrc:   PainSc: 7                  KEPHART,WILLIAM K

## 2018-01-10 NOTE — Op Note (Signed)
Preoperative diagnosis:  biliary colic  Postoperative diagnosis: same as above  Procedure: Laparoscopic Cholecystectomy.   Anesthesia: GETA   Surgeon: Benjamine Sprague  Specimen: Gallbladder  Complications: None  EBL: 64mL  Wound Classification: Clean Contaminated  Indications: see HPI  Findings: Critical view of safety noted Cystic duct and artery identified, ligated and divided, clips remained intact at end of procedure Adequate hemostasis  Description of procedure: The patient was placed on the operating table in the supine position. SCDs placed, pre-op abx administered.  General anesthesia was induced and OG tube placed by anesthesia. A time-out was completed verifying correct patient, procedure, site, positioning, and implant(s) and/or special equipment prior to beginning this procedure. The abdomen was prepped and draped in the usual sterile fashion.  An incision was made in a natural skin line under the umbilicus.  Dissection carried down to fascia where two 0 vicryl sutures placed to use as anchor sutures for hasson port.  Incision made into fascia and blunt dissection used to enter peritoneum.  Hasson port placed and insufflation started up to 46mm Hg without any dramatic increase in pressure.    The laparoscope was inserted and the abdomen inspected. No injuries from initial trocar placement were noted. Additional trocars were then inserted under direct visualization in the following locations: a 5-mm trocar in the subxyphoid region and two 5-mm trocars along the right costal margin. The abdomen was inspected and no abnormalities or injuries were found. The table was placed in the reverse Trendelenburg position with the right side up.  Filmy adhesions between the gallbladder and omentum, duodenum and transverse colon were lysed sharply. The dome of the gallbladder was grasped with an atraumatic grasper passed through the lateral port and retracted over the dome of the liver. The  infundibulum was also grasped with an atraumatic grasper and retracted toward the right lower quadrant. This maneuver exposed Calot's triangle. The peritoneum overlying the gallbladder infundibulum was then dissected and the cystic duct and cystic artery identified.  Critical view of safety with the liver bed clearly visible behind the duct and artery with no additional structures noted. Cystic duct and cystic artery clipped and divided close to the gallbladder.   Afterwards, the cyst duct clips were noted to be very loose off the ducts.  The stump was placed on tension and 0pds endoloop was placed around the cystic stump, careful to avoid pulling in the common bile duct structures.  No bile was noted to be leaking from stump after endoloop placed.  The gallbladder was then dissected from its peritoneal and liver bed attachments by electrocautery. Hemostasis was checked and the gallbladder was removed using an endoscopic retrieval bag placed through the umbilical port. The gallbladder was passed off the table as a specimen. The gallbladder fossa was copiously irrigated with saline and any leaked bile was suctioned out, and hemostasis was obtained. There was no evidence of bleeding from the gallbladder fossa or cystic artery or leakage of the bile from the cystic duct stump after several minutes of observation. Abdomen desufflated and secondary trocars were removed under direct vision. No bleeding was noted. The laparoscope was withdrawn and the umbilical trocar removed.  The fascia of the Hasson trocar site was closed with figure-of-eight 0 vicryl sutures.  3-0 vicryl used to close deep dermal layer at umbilical site.  All skin incisions then closed with subcuticular sutures of 4-0 monocryl and dressed with topical skin adhesive. The orogastric tube was removed and patient extubated. The patient tolerated the procedure  well and was taken to the postanesthesia care unit in stable condition.  All sponge and  instrument count correct at end of procedure.

## 2018-01-10 NOTE — Transfer of Care (Signed)
Immediate Anesthesia Transfer of Care Note  Patient: Tamara Lawrence  Procedure(s) Performed: LAPAROSCOPIC CHOLECYSTECTOMY (N/A Abdomen)  Patient Location: PACU  Anesthesia Type:General  Level of Consciousness: awake and sedated  Airway & Oxygen Therapy: Patient Spontanous Breathing and Patient connected to face mask oxygen  Post-op Assessment: Report given to RN and Post -op Vital signs reviewed and stable  Post vital signs: Reviewed and stable  Last Vitals:  Vitals Value Taken Time  BP 125/60 01/10/2018 10:17 AM  Temp    Pulse 82 01/10/2018 10:17 AM  Resp    SpO2 100 % 01/10/2018 10:17 AM  Vitals shown include unvalidated device data.  Last Pain:  Vitals:   01/10/18 0812  TempSrc: Tympanic  PainSc: 0-No pain         Complications: No apparent anesthesia complications

## 2018-01-10 NOTE — Anesthesia Preprocedure Evaluation (Signed)
Anesthesia Evaluation  Patient identified by MRN, date of birth, ID band Patient awake    Reviewed: Allergy & Precautions, NPO status , Patient's Chart, lab work & pertinent test results  History of Anesthesia Complications Negative for: history of anesthetic complications  Airway Mallampati: II       Dental   Pulmonary neg sleep apnea, neg COPD, former smoker,           Cardiovascular hypertension, Pt. on medications (-) Past MI and (-) CHF (-) dysrhythmias (-) Valvular Problems/Murmurs     Neuro/Psych neg Seizures    GI/Hepatic Neg liver ROS, GERD  Medicated,  Endo/Other  neg diabetes  Renal/GU negative Renal ROS     Musculoskeletal   Abdominal   Peds  Hematology   Anesthesia Other Findings   Reproductive/Obstetrics                            Anesthesia Physical Anesthesia Plan  ASA: II  Anesthesia Plan: General   Post-op Pain Management:    Induction:   PONV Risk Score and Plan: 3 and Ondansetron, Dexamethasone and Treatment may vary due to age or medical condition  Airway Management Planned: Oral ETT  Additional Equipment:   Intra-op Plan:   Post-operative Plan:   Informed Consent: I have reviewed the patients History and Physical, chart, labs and discussed the procedure including the risks, benefits and alternatives for the proposed anesthesia with the patient or authorized representative who has indicated his/her understanding and acceptance.     Plan Discussed with:   Anesthesia Plan Comments:         Anesthesia Quick Evaluation

## 2018-01-10 NOTE — Anesthesia Post-op Follow-up Note (Signed)
Anesthesia QCDR form completed.        

## 2018-01-11 LAB — SURGICAL PATHOLOGY

## 2018-01-31 ENCOUNTER — Other Ambulatory Visit: Payer: Self-pay | Admitting: Surgery

## 2018-01-31 DIAGNOSIS — R1011 Right upper quadrant pain: Secondary | ICD-10-CM

## 2018-02-09 ENCOUNTER — Ambulatory Visit
Admission: RE | Admit: 2018-02-09 | Discharge: 2018-02-09 | Disposition: A | Discharge status: Home / Self Care | Payer: Medicare Other | Source: Ambulatory Visit | Attending: Surgery | Admitting: Surgery

## 2018-02-09 ENCOUNTER — Encounter (INDEPENDENT_AMBULATORY_CARE_PROVIDER_SITE_OTHER): Payer: Self-pay

## 2018-02-09 DIAGNOSIS — K7689 Other specified diseases of liver: Secondary | ICD-10-CM | POA: Insufficient documentation

## 2018-02-09 DIAGNOSIS — N281 Cyst of kidney, acquired: Secondary | ICD-10-CM | POA: Insufficient documentation

## 2018-02-09 DIAGNOSIS — R1011 Right upper quadrant pain: Secondary | ICD-10-CM | POA: Insufficient documentation

## 2018-02-09 DIAGNOSIS — Z9049 Acquired absence of other specified parts of digestive tract: Secondary | ICD-10-CM | POA: Insufficient documentation

## 2019-02-04 ENCOUNTER — Emergency Department
Admission: EM | Admit: 2019-02-04 | Discharge: 2019-02-04 | Disposition: A | Discharge status: Home / Self Care | Payer: Medicare Other | Attending: Emergency Medicine | Admitting: Emergency Medicine

## 2019-02-04 ENCOUNTER — Emergency Department: Payer: Medicare Other

## 2019-02-04 ENCOUNTER — Other Ambulatory Visit: Payer: Self-pay

## 2019-02-04 ENCOUNTER — Encounter: Payer: Self-pay | Admitting: Emergency Medicine

## 2019-02-04 DIAGNOSIS — I10 Essential (primary) hypertension: Secondary | ICD-10-CM | POA: Diagnosis not present

## 2019-02-04 DIAGNOSIS — Z87891 Personal history of nicotine dependence: Secondary | ICD-10-CM | POA: Diagnosis not present

## 2019-02-04 DIAGNOSIS — Y929 Unspecified place or not applicable: Secondary | ICD-10-CM | POA: Diagnosis not present

## 2019-02-04 DIAGNOSIS — Y999 Unspecified external cause status: Secondary | ICD-10-CM | POA: Diagnosis not present

## 2019-02-04 DIAGNOSIS — S93491A Sprain of other ligament of right ankle, initial encounter: Secondary | ICD-10-CM

## 2019-02-04 DIAGNOSIS — S99911A Unspecified injury of right ankle, initial encounter: Secondary | ICD-10-CM | POA: Diagnosis present

## 2019-02-04 DIAGNOSIS — Z79899 Other long term (current) drug therapy: Secondary | ICD-10-CM | POA: Diagnosis not present

## 2019-02-04 DIAGNOSIS — Y939 Activity, unspecified: Secondary | ICD-10-CM | POA: Diagnosis not present

## 2019-02-04 DIAGNOSIS — W109XXA Fall (on) (from) unspecified stairs and steps, initial encounter: Secondary | ICD-10-CM | POA: Diagnosis not present

## 2019-02-04 MED ORDER — ACETAMINOPHEN 500 MG PO TABS
1000.0000 mg | ORAL_TABLET | Freq: Once | ORAL | Status: AC
  Administered 2019-02-04: 14:00:00 1000 mg via ORAL
  Filled 2019-02-04: qty 2

## 2019-02-04 NOTE — ED Notes (Signed)

## 2019-02-04 NOTE — ED Triage Notes (Signed)
Pt presents to ED via POV with c/o mechanical fall, pt c/o R ankle/foot pain at this time.

## 2019-02-04 NOTE — Discharge Instructions (Addendum)
Please rest ice and elevate the right ankle.  Use a walker or crutches to help with ambulation until you are able to ambulate without a limp.  You may use Tylenol and ibuprofen as needed for pain.  If no improvement 1 week follow-up with orthopedics or primary care provider.  Return to the ER for any worsening symptoms or urgent changes in health.

## 2019-02-04 NOTE — ED Provider Notes (Signed)
[facility] EMERGENCY DEPARTMENT Provider Note   CSN: [id number] Arrival date & time: 02/04/19  1109     History   Chief Complaint Chief Complaint  Patient presents with  . Fall    HPI Tamara Lawrence is a 73 y.o. female presents to the emergency department evaluation of a mechanical fall that began earlier today, she states she slipped on the last step, inverting her right ankle and has pain to the medial top and lateral aspect of ankle.  She is unable to bear weight.  Has access to crutches and/or walker at home.  She is not any medications for pain.  Pain is moderate.  No other injury to her body.  Pain is moderate with touch improved with rest.     HPI  Past Medical History:  Diagnosis Date  . GERD (gastroesophageal reflux disease)   . Hypertension     There are no active problems to display for this patient.   Past Surgical History:  Procedure Laterality Date  . CHOLECYSTECTOMY N/A 01/10/2018   Procedure: LAPAROSCOPIC CHOLECYSTECTOMY;  Surgeon: Benjamine Sprague, DO;  Location: [facility] ORS;  Service: General;  Laterality: N/A;  . TONSILLECTOMY  1956     OB History   No obstetric history on file.      Home Medications    Prior to Admission medications   Medication Sig Start Date End Date Taking? Authorizing Provider  cetirizine (ZYRTEC) 10 MG tablet Take 10 mg by mouth daily.    [provider]  ibuprofen (ADVIL,MOTRIN) 800 MG tablet Take 1 tablet (800 mg total) by mouth every 8 (eight) hours as needed for mild pain or moderate pain. 01/10/18   Sakai, Isami, DO  lisinopril (PRINIVIL,ZESTRIL) 5 MG tablet Take 5 mg by mouth daily.    [provider]  omeprazole (PRILOSEC) 20 MG capsule Take 20 mg by mouth daily.    [provider]  sertraline (ZOLOFT) 25 MG tablet Take 25 mg by mouth daily.    [provider]  sertraline (ZOLOFT) 50 MG tablet Take 50 mg by mouth daily.    [provider]    Family  History Family History  Problem Relation Age of Onset  . Breast cancer Maternal Aunt 36    Social History Social History   Tobacco Use  . Smoking status: Former Smoker    Quit date: 1999    Years since quitting: 21.9  . Smokeless tobacco: Never Used  Substance Use Topics  . Alcohol use: Yes    Comment: rare  . Drug use: Never     Allergies   Adhesive [tape]   Review of Systems Review of Systems  Constitutional: Negative.   Cardiovascular: Negative for chest pain and leg swelling.  Gastrointestinal: Negative for abdominal pain.  Musculoskeletal: Positive for arthralgias and gait problem. Negative for back pain, joint swelling and neck pain.  Skin: Negative for color change, rash and wound.  Neurological: Negative for dizziness, syncope and weakness.  Psychiatric/Behavioral: Negative for confusion and hallucinations.  All other systems reviewed and are negative.    Physical Exam Updated Vital Signs BP (!) 142/60 (BP Location: Right Arm)   Pulse 85   Temp 98.7 F (37.1 C) (Oral)   Resp 18   Ht 5\' 2"  (1.575 m)   Wt 78.9 kg   SpO2 97%   BMI 31.83 kg/m   Physical Exam Constitutional:      General: She is not in acute distress.    Appearance:  She is well-developed.  HENT:     Head: Normocephalic and atraumatic.  Eyes:     Pupils: Pupils are equal, round, and reactive to light.  Neck:     Musculoskeletal: Normal range of motion and neck supple.  Cardiovascular:     Rate and Rhythm: Normal rate and regular rhythm.  Pulmonary:     Effort: No respiratory distress.  Musculoskeletal:     Right ankle: She exhibits decreased range of motion and swelling. She exhibits no ecchymosis, no deformity, no laceration and normal pulse. Tenderness. Lateral malleolus and AITFL tenderness found. Achilles tendon exhibits no pain, no defect and normal Thompson's test results.     Left ankle: She exhibits normal range of motion, no swelling, no ecchymosis, no deformity, no  laceration and normal pulse. No tenderness. No lateral malleolus and no AITFL tenderness found. Achilles tendon exhibits no pain, no defect and normal Thompson's test results.  Skin:    General: Skin is warm and dry.  Neurological:     Mental Status: She is alert and oriented to person, place, and time.  Psychiatric:        Behavior: Behavior normal.        Thought Content: Thought content normal.        Judgment: Judgment normal.      ED Treatments / Results  Labs (all labs ordered are listed, but only abnormal results are displayed) Labs Reviewed - No data to display  EKG None  Radiology Dg Ankle Complete Right  Result Date: 02/04/2019 CLINICAL DATA:  Lateral right ankle pain due to a twisting injury this morning. Initial encounter. EXAM: RIGHT ANKLE - COMPLETE 3+ VIEW COMPARISON:  None. FINDINGS: There is no evidence of fracture, dislocation, or joint effusion. There is no evidence of arthropathy or other focal bone abnormality. Plantar calcaneal spur noted. Soft tissues are unremarkable. IMPRESSION: Negative exam. Electronically Signed   By: Inge Rise M.D.   On: 02/04/2019 12:47   Dg Foot Complete Right  Result Date: 02/04/2019 CLINICAL DATA:  Lateral right foot pain due to a twisting injury this morning. Initial encounter. EXAM: RIGHT FOOT COMPLETE - 3+ VIEW COMPARISON:  None. FINDINGS: There is no evidence of fracture or dislocation. There is no evidence of arthropathy or other focal bone abnormality. Soft tissues are unremarkable. IMPRESSION: Negative exam. Electronically Signed   By: Inge Rise M.D.   On: 02/04/2019 12:49    Procedures Procedures (including critical care time)  Medications Ordered in ED Medications  acetaminophen (TYLENOL) tablet 1,000 mg (has no administration in time range)     Initial Impression / Assessment and Plan / ED Course  I have reviewed the triage vital signs and the nursing notes.  Pertinent labs & imaging results that  were available during my care of the patient were reviewed by me and considered in my medical decision making (see chart for details).        73 year old female with right lateral ankle sprain.  X-rays show no evidence of acute bony abnormality of the right ankle and right foot.  No other injuries to her body.  She is placed into ankle splint.  She will use crutches and walker as needed for ambulation.  Tylenol and ibuprofen as needed for pain.  She understands signs symptoms return to ED for.  Follow-up with orthopedist or PCP in 1 week if no improvement.  Final Clinical Impressions(s) / ED Diagnoses   Final diagnoses:  Sprain of anterior talofibular ligament of right ankle,  initial encounter    ED Discharge Orders    None       Renata Caprice 02/04/19 1314    Carrie Mew, MD 02/04/19 [phone]

## 2019-03-28 ENCOUNTER — Ambulatory Visit (INDEPENDENT_AMBULATORY_CARE_PROVIDER_SITE_OTHER): Payer: Medicare Other | Admitting: Gastroenterology

## 2019-03-28 ENCOUNTER — Other Ambulatory Visit: Payer: Self-pay

## 2019-03-28 ENCOUNTER — Encounter: Payer: Self-pay | Admitting: Gastroenterology

## 2019-03-28 ENCOUNTER — Telehealth: Payer: Self-pay | Admitting: Gastroenterology

## 2019-03-28 VITALS — BP 117/70 | HR 93 | Temp 98.7°F | Resp 17 | Wt 168.8 lb

## 2019-03-28 DIAGNOSIS — K219 Gastro-esophageal reflux disease without esophagitis: Secondary | ICD-10-CM | POA: Insufficient documentation

## 2019-03-28 DIAGNOSIS — Z800 Family history of malignant neoplasm of digestive organs: Secondary | ICD-10-CM

## 2019-03-28 DIAGNOSIS — Z1211 Encounter for screening for malignant neoplasm of colon: Secondary | ICD-10-CM

## 2019-03-28 DIAGNOSIS — I10 Essential (primary) hypertension: Secondary | ICD-10-CM | POA: Insufficient documentation

## 2019-03-28 MED ORDER — ONDANSETRON HCL 4 MG PO TABS: 4.0000 mg | ORAL_TABLET | Freq: Four times a day (QID) | ORAL | 0 refills | Status: DC | PRN

## 2019-03-28 NOTE — Telephone Encounter (Signed)
Tamara Lawrence with [facility] in [place] called stating someone called in a prescription for SUTAB for patient & it is not covered by her insurance. Please call in something else or obtain precert.

## 2019-03-28 NOTE — Progress Notes (Signed)
[name], MD [address]  [place]  [place], [place] [address]  Main: [phone]  Fax: [phone]    Gastroenterology Consultation  Referring Provider:     Sofie Hartigan, MD Primary Care Physician:  Sofie Hartigan, MD Primary Gastroenterologist:  Dr. [name] Reason for Consultation: Discuss about colonoscopy for screening, chronic GERD        HPI:   Tamara Lawrence is a 74 y.o. female referred by Dr. Ellison Hughs, Chrissie Noa, MD  for consultation & management of colon cancer screening and chronic GERD.  Patient has a family history of colon cancer in her mother at age 39, survived for 5 years, had metastasis to the liver.  Patient has been undergoing surveillance colonoscopies every 5 years.  Her last one was in 2014 by Dr. Gustavo Lah.  Patient wanted to discuss about her colonoscopy before scheduling directly.  She was told that her colon was extremely tortuous and Dr. Gustavo Lah had to use pediatric colonoscope every time and had to use manual pressure to complete the procedure.  She had a bout of diverticulitis 2 weeks after her last colonoscopy.  Patient wanted to make sure that her concerns are addressed before scheduling colonoscopy.  That is why, she is here today.  She otherwise does not have constipation, rectal bleeding or abdominal pain.  She does have intermittent diarrhea since cholecystectomy in 2019.  Additionally, she does have heartburn for which she takes omeprazole 20 mg at bedtime for at least 10 years.  She never had an upper endoscopy.  She denies dysphagia. Her CBC and LFTs are normal in 09/2018  S/p lap chole in 01/10/2018 She does not smoke or drink alcohol  NSAIDs: Ibuprofen as needed for musculoskeletal pain  Antiplts/Anticoagulants/Anti thrombotics: None  GI Procedures: Colonoscopy 10/02/2012 by Dr Gustavo Lah Diagnosis:  Part A: RECTO SIGMOID JUNCTION POLYP COLD BIOPSY:  - [place].  .  Part B: RECTUM POLYP COLD SNARE:  - BENIGN COLONIC MUCOSA WITH FOCAL LYMPHOID AGGREGATE.  - NEGATIVE FOR DYSPLASIA AND MALIGNANCY.   Past Medical History:  Diagnosis Date  . GERD (gastroesophageal reflux disease)   . Hypertension     Past Surgical History:  Procedure Laterality Date  . CHOLECYSTECTOMY N/A 01/10/2018   Procedure: LAPAROSCOPIC CHOLECYSTECTOMY;  Surgeon: Benjamine Sprague, DO;  Location: [facility] ORS;  Service: General;  Laterality: N/A;  . TONSILLECTOMY  1956    Current Outpatient Medications:  .  cetirizine (ZYRTEC) 10 MG tablet, Take 10 mg by mouth daily., Disp: , Rfl:  .  ibuprofen (ADVIL,MOTRIN) 800 MG tablet, Take 1 tablet (800 mg total) by mouth every 8 (eight) hours as needed for mild pain or moderate pain., Disp: 30 tablet, Rfl: 0 .  lisinopril (PRINIVIL,ZESTRIL) 5 MG tablet, Take 5 mg by mouth daily., Disp: , Rfl:  .  omeprazole (PRILOSEC) 20 MG capsule, Take 20 mg by mouth daily., Disp: , Rfl:  .  sertraline (ZOLOFT) 25 MG tablet, Take 25 mg by mouth daily., Disp: , Rfl:  .  sertraline (ZOLOFT) 50 MG tablet, Take 50 mg by mouth daily., Disp: , Rfl:  .  ondansetron (ZOFRAN) 4 MG tablet, Take 1 tablet (4 mg total) by mouth every 6 (six) hours as needed for up to 14 doses for nausea or vomiting., Disp: 14 tablet, Rfl: 0   Family History  Problem Relation Age of Onset  . Breast cancer Maternal Aunt 73  Social History   Tobacco Use  . Smoking status: Former Smoker    Quit date: 1999    Years since quitting: 22.0  . Smokeless tobacco: Never Used  Substance Use Topics  . Alcohol use: Yes    Comment: rare  . Drug use: Never    Allergies as of 03/28/2019 - Review Complete 03/28/2019  Allergen Reaction Noted  . Adhesive [tape] Rash 01/02/2018    Review of Systems:    All systems reviewed and negative except where noted in HPI.   Physical Exam:  BP 117/70 (BP Location: Left Arm, Patient Position: Sitting, Cuff Size: Large)   Pulse 93    Temp 98.7 F (37.1 C)   Resp 17   Wt 168 lb 12.8 oz (76.6 kg)   BMI 30.87 kg/m  No LMP recorded. Patient is postmenopausal.  General:   Alert,  Well-developed, well-nourished, pleasant and cooperative in NAD Head:  Normocephalic and atraumatic. Eyes:  Sclera clear, no icterus.   Conjunctiva pink. Ears:  Normal auditory acuity. Nose:  No deformity, discharge, or lesions. Mouth:  No deformity or lesions,oropharynx pink & moist. Neck:  Supple; no masses or thyromegaly. Lungs:  Respirations even and unlabored.  Clear throughout to auscultation.   No wheezes, crackles, or rhonchi. No acute distress. Heart:  Regular rate and rhythm; no murmurs, clicks, rubs, or gallops. Abdomen:  Normal bowel sounds. Soft, non-tender and non-distended without masses, hepatosplenomegaly or hernias noted.  No guarding or rebound tenderness.   Rectal: Not performed Msk:  Symmetrical without gross deformities. Good, equal movement & strength bilaterally. Pulses:  Normal pulses noted. Extremities:  No clubbing or edema.  No cyanosis. Neurologic:  Alert and oriented x3;  grossly normal neurologically. Skin:  Intact without significant lesions or rashes. No jaundice. Psych:  Alert and cooperative. Normal mood and affect.  Imaging Studies: Reviewed  Assessment and Plan:   Tamara Lawrence is a 74 y.o. female with history of hypertension, chronic GERD is seen in consultation to discuss about colonoscopy prior to undergoing screening.  Patient has family history of colon cancer in first-degree relative  Screening colonoscopy: High risk for colon cancer Reassured and discussed in length with patient about colonoscopy, risks and benefits and I will be using pediatric colonoscope Also, discussed with her about bowel prep Suprep, SuTab and she prefers to try [employer] Instructions provided.  Also, prescribed her Zofran for nausea if needed during bowel prep  Chronic GERD: On long-term PPI Recommend upper endoscopy  for Barrett's screening   Follow up as needed   [name], MD

## 2019-04-19 ENCOUNTER — Telehealth: Payer: Self-pay | Admitting: Gastroenterology

## 2019-04-19 NOTE — Telephone Encounter (Signed)
Patient called & wants to cancel her colonoscopy for 05-15-19 & reschedule in August.

## 2019-05-02 NOTE — Telephone Encounter (Signed)
Patient is calling she wants to move her colonoscopy from March 9th to October. Informed patient I would put patient on recall list for October and we would sent her a letter when it got closer to the appointment. Canceled colonoscopy

## 2019-05-15 ENCOUNTER — Ambulatory Visit: Admit: 2019-05-15 | Payer: Medicare Other | Admitting: Gastroenterology

## 2019-05-15 SURGERY — COLONOSCOPY WITH PROPOFOL
Anesthesia: Choice

## 2019-08-22 ENCOUNTER — Other Ambulatory Visit: Payer: Self-pay

## 2019-08-22 DIAGNOSIS — K219 Gastro-esophageal reflux disease without esophagitis: Secondary | ICD-10-CM

## 2019-08-22 DIAGNOSIS — Z1211 Encounter for screening for malignant neoplasm of colon: Secondary | ICD-10-CM

## 2019-08-22 MED ORDER — NA SULFATE-K SULFATE-MG SULF 17.5-3.13-1.6 GM/177ML PO SOLN
354.0000 mL | Freq: Once | ORAL | 0 refills | Status: AC
Start: 2019-08-22 — End: 2019-08-22

## 2019-09-27 ENCOUNTER — Other Ambulatory Visit: Payer: Self-pay | Admitting: Family Medicine

## 2019-09-27 DIAGNOSIS — Z1231 Encounter for screening mammogram for malignant neoplasm of breast: Secondary | ICD-10-CM

## 2019-10-16 ENCOUNTER — Other Ambulatory Visit: Payer: Self-pay

## 2019-10-16 ENCOUNTER — Other Ambulatory Visit
Admission: RE | Admit: 2019-10-16 | Discharge: 2019-10-16 | Disposition: A | Discharge status: Home / Self Care | Payer: Medicare Other | Source: Ambulatory Visit | Attending: Gastroenterology | Admitting: Gastroenterology

## 2019-10-16 DIAGNOSIS — Z20822 Contact with and (suspected) exposure to covid-19: Secondary | ICD-10-CM | POA: Diagnosis not present

## 2019-10-16 DIAGNOSIS — Z01812 Encounter for preprocedural laboratory examination: Secondary | ICD-10-CM | POA: Diagnosis present

## 2019-10-16 LAB — SARS CORONAVIRUS 2 (TAT 6-24 HRS): SARS Coronavirus 2: NEGATIVE

## 2019-10-17 ENCOUNTER — Encounter: Payer: Self-pay | Admitting: Gastroenterology

## 2019-10-18 ENCOUNTER — Other Ambulatory Visit: Payer: Self-pay

## 2019-10-18 ENCOUNTER — Ambulatory Visit
Admission: RE | Admit: 2019-10-18 | Discharge: 2019-10-18 | Disposition: A | Discharge status: Home / Self Care | Payer: Medicare Other | Attending: Gastroenterology | Admitting: Gastroenterology

## 2019-10-18 ENCOUNTER — Encounter
Admission: RE | Disposition: A | Discharge status: Home / Self Care | Payer: Self-pay | Source: Home / Self Care | Attending: Gastroenterology

## 2019-10-18 ENCOUNTER — Ambulatory Visit: Payer: Medicare Other | Admitting: Registered Nurse

## 2019-10-18 DIAGNOSIS — I10 Essential (primary) hypertension: Secondary | ICD-10-CM | POA: Diagnosis not present

## 2019-10-18 DIAGNOSIS — D120 Benign neoplasm of cecum: Secondary | ICD-10-CM | POA: Insufficient documentation

## 2019-10-18 DIAGNOSIS — K5730 Diverticulosis of large intestine without perforation or abscess without bleeding: Secondary | ICD-10-CM | POA: Diagnosis not present

## 2019-10-18 DIAGNOSIS — K644 Residual hemorrhoidal skin tags: Secondary | ICD-10-CM | POA: Insufficient documentation

## 2019-10-18 DIAGNOSIS — D123 Benign neoplasm of transverse colon: Secondary | ICD-10-CM | POA: Diagnosis not present

## 2019-10-18 DIAGNOSIS — K219 Gastro-esophageal reflux disease without esophagitis: Secondary | ICD-10-CM | POA: Insufficient documentation

## 2019-10-18 DIAGNOSIS — K317 Polyp of stomach and duodenum: Secondary | ICD-10-CM | POA: Diagnosis not present

## 2019-10-18 DIAGNOSIS — Z13810 Encounter for screening for upper gastrointestinal disorder: Secondary | ICD-10-CM | POA: Diagnosis not present

## 2019-10-18 DIAGNOSIS — Z79899 Other long term (current) drug therapy: Secondary | ICD-10-CM | POA: Insufficient documentation

## 2019-10-18 DIAGNOSIS — D121 Benign neoplasm of appendix: Secondary | ICD-10-CM | POA: Insufficient documentation

## 2019-10-18 DIAGNOSIS — K2960 Other gastritis without bleeding: Secondary | ICD-10-CM | POA: Diagnosis not present

## 2019-10-18 DIAGNOSIS — Z87891 Personal history of nicotine dependence: Secondary | ICD-10-CM | POA: Diagnosis not present

## 2019-10-18 DIAGNOSIS — K635 Polyp of colon: Secondary | ICD-10-CM | POA: Diagnosis not present

## 2019-10-18 DIAGNOSIS — Z1211 Encounter for screening for malignant neoplasm of colon: Secondary | ICD-10-CM | POA: Diagnosis not present

## 2019-10-18 HISTORY — PX: ESOPHAGOGASTRODUODENOSCOPY (EGD) WITH PROPOFOL: SHX5813

## 2019-10-18 HISTORY — PX: COLONOSCOPY WITH PROPOFOL: SHX5780

## 2019-10-18 SURGERY — COLONOSCOPY WITH PROPOFOL
Anesthesia: General

## 2019-10-18 MED ORDER — LIDOCAINE HCL (CARDIAC) PF 100 MG/5ML IV SOSY
PREFILLED_SYRINGE | INTRAVENOUS | Status: DC | PRN
  Administered 2019-10-18: 80 mg via INTRAVENOUS

## 2019-10-18 MED ORDER — PROPOFOL 10 MG/ML IV BOLUS
INTRAVENOUS | Status: AC
  Filled 2019-10-18: qty 20

## 2019-10-18 MED ORDER — PROPOFOL 10 MG/ML IV BOLUS
INTRAVENOUS | Status: DC | PRN
  Administered 2019-10-18: 80 mg via INTRAVENOUS

## 2019-10-18 MED ORDER — SODIUM CHLORIDE 0.9 % IV SOLN: INTRAVENOUS | Status: DC

## 2019-10-18 MED ORDER — PROPOFOL 500 MG/50ML IV EMUL
INTRAVENOUS | Status: DC | PRN
  Administered 2019-10-18: 150 ug/kg/min via INTRAVENOUS

## 2019-10-18 NOTE — Anesthesia Preprocedure Evaluation (Signed)
Anesthesia Evaluation  Patient identified by MRN, date of birth, ID band Patient awake    Reviewed: Allergy & Precautions, NPO status , Patient's Chart, lab work & pertinent test results  History of Anesthesia Complications Negative for: history of anesthetic complications  Airway Mallampati: III       Dental   Pulmonary neg sleep apnea, neg COPD, Not current smoker, former smoker,           Cardiovascular hypertension (currently off meds), (-) Past MI and (-) CHF (-) dysrhythmias (-) Valvular Problems/Murmurs     Neuro/Psych neg Seizures    GI/Hepatic Neg liver ROS, GERD  Medicated and Controlled,  Endo/Other  neg diabetes  Renal/GU negative Renal ROS     Musculoskeletal   Abdominal   Peds  Hematology   Anesthesia Other Findings   Reproductive/Obstetrics                             Anesthesia Physical Anesthesia Plan  ASA: II  Anesthesia Plan: General   Post-op Pain Management:    Induction: Intravenous  PONV Risk Score and Plan: 3 and Propofol infusion, TIVA and Treatment may vary due to age or medical condition  Airway Management Planned: Nasal Cannula  Additional Equipment:   Intra-op Plan:   Post-operative Plan:   Informed Consent: I have reviewed the patients History and Physical, chart, labs and discussed the procedure including the risks, benefits and alternatives for the proposed anesthesia with the patient or authorized representative who has indicated his/her understanding and acceptance.       Plan Discussed with:   Anesthesia Plan Comments:         Anesthesia Quick Evaluation

## 2019-10-18 NOTE — Op Note (Signed)
[facility] Gastroenterology Patient Name: Tamara Lawrence Procedure Date: 10/18/2019 8:39 AM MRN: [id number] Account #: [id number] Date of Birth: September 11, 1945 Admit Type: Outpatient Age: 74 Room: [facility] ENDO ROOM 3 Gender: Female Note Status: Finalized Procedure:             Colonoscopy Indications:           Screening for colorectal malignant neoplasm, Last                         colonoscopy: July 2014 Providers:             Lin Landsman MD, MD Referring MD:          Sofie Hartigan (Referring MD) Medicines:             Monitored Anesthesia Care Complications:         No immediate complications. Estimated blood loss: None. Procedure:             Pre-Anesthesia Assessment:                        - Prior to the procedure, a History and Physical was                         performed, and patient medications and allergies were                         reviewed. The patient is competent. The risks and                         benefits of the procedure and the sedation options and                         risks were discussed with the patient. All questions                         were answered and informed consent was obtained.                         Patient identification and proposed procedure were                         verified by the physician, the nurse, the                         anesthesiologist, the anesthetist and the technician                         in the pre-procedure area in the procedure room in the                         endoscopy suite. Mental Status Examination: alert and                         oriented. Airway Examination: normal oropharyngeal                         airway and neck mobility. Respiratory Examination:  clear to auscultation. CV Examination: normal.                         Prophylactic Antibiotics: The patient does not require                         prophylactic antibiotics. Prior Anticoagulants: The                          patient has taken no previous anticoagulant or                         antiplatelet agents. ASA Grade Assessment: II - A                         patient with mild systemic disease. After reviewing                         the risks and benefits, the patient was deemed in                         satisfactory condition to undergo the procedure. The                         anesthesia plan was to use monitored anesthesia care                         (MAC). Immediately prior to administration of                         medications, the patient was re-assessed for adequacy                         to receive sedatives. The heart rate, respiratory                         rate, oxygen saturations, blood pressure, adequacy of                         pulmonary ventilation, and response to care were                         monitored throughout the procedure. The physical                         status of the patient was re-assessed after the                         procedure.                        After obtaining informed consent, the colonoscope was                         passed under direct vision. Throughout the procedure,                         the patient's blood pressure, pulse, and oxygen  saturations were monitored continuously. The                         Colonoscope was introduced through the anus and                         advanced to the the cecum, identified by appendiceal                         orifice and ileocecal valve. The Enteroscope was                         introduced through the anus and advanced to the the                         cecum, identified by appendiceal orifice and ileocecal                         valve. The colonoscopy was extremely difficult due to                         multiple diverticula in the colon. Successful                         completion of the procedure was aided by changing the                          patient to a supine position, changing the patient to                         a prone position, withdrawing the scope and replacing                         with the pediatric endoscope, withdrawing the scope                         and replacing with the enteroscope and applying                         abdominal pressure. The patient tolerated the                         procedure fairly well. The quality of the bowel                         preparation was evaluated using the BBPS [facility] Bowel                         Preparation Scale) with scores of: Right Colon = 3,                         Transverse Colon = 3 and Left Colon = 3 (entire mucosa                         seen well with no residual staining, small fragments  of stool or opaque liquid). The total BBPS score                         equals 9. Findings:      Enteroscope was used to reach cecum      The perianal and digital rectal examinations were normal. Pertinent       negatives include normal sphincter tone and no palpable rectal lesions.      Six sessile polyps were found in the transverse colon, cecum and       appendiceal orifice. The polyps were 3 to 5 mm in size. These polyps       were removed with a cold snare. Resection and retrieval were complete.      Multiple diverticula were found in the recto-sigmoid colon, sigmoid       colon and descending colon.      Non-bleeding external hemorrhoids were found during retroflexion. The       hemorrhoids were medium-sized. Impression:            - Six 3 to 5 mm polyps in the transverse colon (1), in                         the cecum (4) and at the appendiceal orifice (1),                         removed with a cold snare. Resected and retrieved.                        - Diverticulosis in the recto-sigmoid colon, in the                         sigmoid colon and in the descending colon.                        - Non-bleeding external  hemorrhoids. Recommendation:        - Discharge patient to home (with escort).                        - Resume previous diet today.                        - Continue present medications.                        - Await pathology results.                        - Repeat colonoscopy in 3 - 5 years for surveillance                         based on pathology results.                        - Return to my office as previously scheduled. Procedure Code(s):     --- Professional ---                        [phone], Colonoscopy, flexible; with removal of  tumor(s), polyp(s), or other lesion(s) by snare                         technique Diagnosis Code(s):     --- Professional ---                        Z12.11, Encounter for screening for malignant neoplasm                         of colon                        K63.5, Polyp of colon                        K64.4, Residual hemorrhoidal skin tags                        K57.30, Diverticulosis of large intestine without                         perforation or abscess without bleeding CPT copyright 2019 [employer] Association. All rights reserved. The codes documented in this report are preliminary and upon coder review may  be revised to meet current compliance requirements. Dr. Ulyess Mort Lin Landsman MD, MD 10/18/2019 9:52:13 AM This report has been signed electronically. Number of Addenda: 0 Note Initiated On: 10/18/2019 8:39 AM Scope Withdrawal Time: 0 hours 24 minutes 28 seconds  Total Procedure Duration: 0 hours 51 minutes 28 seconds  Estimated Blood Loss:  Estimated blood loss: none.      [facility]

## 2019-10-18 NOTE — H&P (Signed)
[name], MD [address]  [place]  [place], [place] [address]  Main: [phone]  Fax: [phone] Pager: [phone]  Primary Care Physician:  Sofie Hartigan, MD Primary Gastroenterologist:  Dr. [name]  Pre-Procedure History & Physical: HPI:  Tamara Lawrence is a 74 y.o. female is here for an endoscopy and colonoscopy.   Past Medical History:  Diagnosis Date  . GERD (gastroesophageal reflux disease)   . Hypertension     Past Surgical History:  Procedure Laterality Date  . CHOLECYSTECTOMY N/A 01/10/2018   Procedure: LAPAROSCOPIC CHOLECYSTECTOMY;  Surgeon: Benjamine Sprague, DO;  Location: [facility] ORS;  Service: General;  Laterality: N/A;  . TONSILLECTOMY  1956    Prior to Admission medications   Medication Sig Start Date End Date Taking? Authorizing Provider  cetirizine (ZYRTEC) 10 MG tablet Take 10 mg by mouth daily.   Yes [provider]  ibuprofen (ADVIL,MOTRIN) 800 MG tablet Take 1 tablet (800 mg total) by mouth every 8 (eight) hours as needed for mild pain or moderate pain. 01/10/18   Sakai, Isami, DO  lisinopril (PRINIVIL,ZESTRIL) 5 MG tablet Take 5 mg by mouth daily. Patient not taking: Reported on 10/18/2019    [provider]  omeprazole (PRILOSEC) 20 MG capsule Take 20 mg by mouth daily.    [provider]  ondansetron (ZOFRAN) 4 MG tablet Take 1 tablet (4 mg total) by mouth every 6 (six) hours as needed for up to 14 doses for nausea or vomiting. 03/28/19   Lin Landsman, MD  sertraline (ZOLOFT) 25 MG tablet Take 25 mg by mouth daily.    [provider]  sertraline (ZOLOFT) 50 MG tablet Take 50 mg by mouth daily.    [provider]    Allergies as of 08/22/2019 - Review Complete 03/28/2019  Allergen Reaction Noted  . Adhesive [tape] Rash 01/02/2018    Family History  Problem Relation Age of Onset  . Breast cancer Maternal Aunt 21    Social History   Socioeconomic History  . Marital  status: Single    Spouse name: Not on file  . Number of children: Not on file  . Years of education: Not on file  . Highest education level: Not on file  Occupational History  . Not on file  Tobacco Use  . Smoking status: Former Smoker    Quit date: 1999    Years since quitting: 22.6  . Smokeless tobacco: Never Used  Vaping Use  . Vaping Use: Never used  Substance and Sexual Activity  . Alcohol use: Yes    Comment: rare  . Drug use: Never  . Sexual activity: Not on file  Other Topics Concern  . Not on file  Social History Narrative  . Not on file   Social Determinants of Health   Financial Resource Strain:   . Difficulty of Paying Living Expenses:   Food Insecurity:   . Worried About Charity fundraiser in the Last Year:   . Arboriculturist in the Last Year:   Transportation Needs:   . Film/video editor (Medical):   Marland Kitchen Lack of Transportation (Non-Medical):   Physical Activity:   . Days of Exercise per Week:   . Minutes of Exercise per Session:   Stress:   . Feeling of Stress :   Social Connections:   . Frequency of Communication with Friends and Family:   . Frequency of Social Gatherings with Friends and Family:   .  Attends Religious Services:   . Active Member of Clubs or Organizations:   . Attends Archivist Meetings:   Marland Kitchen Marital Status:   Intimate Partner Violence:   . Fear of Current or Ex-Partner:   . Emotionally Abused:   Marland Kitchen Physically Abused:   . Sexually Abused:     Review of Systems: See HPI, otherwise negative ROS  Physical Exam: BP (!) 171/89   Pulse 86   Temp (!) 97.1 F (36.2 C) (Temporal)   Resp 18   Ht 5\' 2"  (1.575 m)   Wt 77.1 kg   SpO2 98%   BMI 31.09 kg/m  General:   Alert,  pleasant and cooperative in NAD Head:  Normocephalic and atraumatic. Neck:  Supple; no masses or thyromegaly. Lungs:  Clear throughout to auscultation.    Heart:  Regular rate and rhythm. Abdomen:  Soft, nontender and nondistended. Normal bowel  sounds, without guarding, and without rebound.   Neurologic:  Alert and  oriented x4;  grossly normal neurologically.  Impression/Plan: Tamara Lawrence is here for an endoscopy and colonoscopy to be performed for Screening colonoscopy: High risk for colon cancer, chronic gerd, barrett's screening  Risks, benefits, limitations, and alternatives regarding  endoscopy and colonoscopy have been reviewed with the patient.  Questions have been answered.  All parties agreeable.   Sherri Sear, MD  10/18/2019, 8:08 AM

## 2019-10-18 NOTE — Op Note (Signed)
[facility] Gastroenterology Patient Name: Tamara Lawrence Procedure Date: 10/18/2019 8:40 AM MRN: [id number] Account #: [id number] Date of Birth: 1945-07-25 Admit Type: Outpatient Age: 74 Room: [facility] ENDO ROOM 3 Gender: Female Note Status: Finalized Procedure:             Upper GI endoscopy Indications:           Screening for Barrett's esophagus, Follow-up of                         gastro-esophageal reflux disease Providers:             Lin Landsman MD, MD Referring MD:          Sofie Hartigan (Referring MD) Medicines:             Monitored Anesthesia Care Complications:         No immediate complications. Estimated blood loss: None. Procedure:             Pre-Anesthesia Assessment:                        - Prior to the procedure, a History and Physical was                         performed, and patient medications and allergies were                         reviewed. The patient is competent. The risks and                         benefits of the procedure and the sedation options and                         risks were discussed with the patient. All questions                         were answered and informed consent was obtained.                         Patient identification and proposed procedure were                         verified by the physician, the nurse, the                         anesthesiologist, the anesthetist and the technician                         in the pre-procedure area in the procedure room in the                         endoscopy suite. Mental Status Examination: normal.                         Airway Examination: normal oropharyngeal airway and                         neck mobility. Respiratory Examination: clear to  auscultation. CV Examination: normal. Prophylactic                         Antibiotics: The patient does not require prophylactic                         antibiotics. Prior Anticoagulants:  The patient has                         taken no previous anticoagulant or antiplatelet                         agents. ASA Grade Assessment: II - A patient with mild                         systemic disease. After reviewing the risks and                         benefits, the patient was deemed in satisfactory                         condition to undergo the procedure. The anesthesia                         plan was to use monitored anesthesia care (MAC).                         Immediately prior to administration of medications,                         the patient was re-assessed for adequacy to receive                         sedatives. The heart rate, respiratory rate, oxygen                         saturations, blood pressure, adequacy of pulmonary                         ventilation, and response to care were monitored                         throughout the procedure. The physical status of the                         patient was re-assessed after the procedure.                        After obtaining informed consent, the endoscope was                         passed under direct vision. Throughout the procedure,                         the patient's blood pressure, pulse, and oxygen                         saturations were monitored continuously. The Endoscope  was introduced through the mouth, and advanced to the                         second part of duodenum. The upper GI endoscopy was                         accomplished without difficulty. The patient tolerated                         the procedure well. Findings:      The duodenal bulb and second portion of the duodenum were normal.      Multiple small sessile polyps with no bleeding and stigmata of recent       bleeding were found in the gastric fundus and in the gastric body.       Biopsies were taken with a cold forceps for histology.      Esophagogastric landmarks were identified: the gastroesophageal  junction       was found at 35 cm from the incisors.      The gastroesophageal junction and examined esophagus were normal.      The incisura, gastric antrum, prepyloric region of the stomach and       pylorus were normal. Biopsies were taken with a cold forceps for       histology. Biopsies were taken with a cold forceps for histology. Impression:            - Normal duodenal bulb and second portion of the                         duodenum.                        - Multiple gastric polyps. Biopsied.                        - Esophagogastric landmarks identified.                        - Normal gastroesophageal junction and esophagus.                        - Normal incisura, antrum, prepyloric region of the                         stomach and pylorus. Biopsied. Recommendation:        - Await pathology results.                        - Continue present medications.                        - Return to my office as previously scheduled.                        - Proceed with colonoscopy as scheduled                        See colonoscopy report Procedure Code(s):     --- Professional ---                        [phone],  Esophagogastroduodenoscopy, flexible,                         transoral; with biopsy, single or multiple Diagnosis Code(s):     --- Professional ---                        K31.7, Polyp of stomach and duodenum                        Z13.810, Encounter for screening for upper                         gastrointestinal disorder                        K21.9, Gastro-esophageal reflux disease without                         esophagitis CPT copyright 2019 American Medical Association. All rights reserved. The codes documented in this report are preliminary and upon coder review may  be revised to meet current compliance requirements. Dr. Ulyess Mort Lin Landsman MD, MD 10/18/2019 8:53:31 AM This report has been signed electronically. Number of Addenda: 0 Note Initiated On:  10/18/2019 8:40 AM Estimated Blood Loss:  Estimated blood loss: none.      [facility]

## 2019-10-18 NOTE — Anesthesia Postprocedure Evaluation (Signed)
Anesthesia Post Note  Patient: Tamara Lawrence  Procedure(s) Performed: COLONOSCOPY WITH PROPOFOL (N/A ) ESOPHAGOGASTRODUODENOSCOPY (EGD) WITH PROPOFOL (N/A )  Patient location during evaluation: Endoscopy Anesthesia Type: General Level of consciousness: awake and alert Pain management: pain level controlled Vital Signs Assessment: post-procedure vital signs reviewed and stable Respiratory status: spontaneous breathing and respiratory function stable Cardiovascular status: stable Anesthetic complications: no   No complications documented.   Last Vitals:  Vitals:   10/18/19 1004 10/18/19 1014  BP: 112/67 134/84  Pulse: 63 (!) 59  Resp: 15 14  Temp:    SpO2: 95% 95%    Last Pain:  Vitals:   10/18/19 1014  TempSrc:   PainSc: 0-No pain                 [name] K

## 2019-10-18 NOTE — Transfer of Care (Signed)
Immediate Anesthesia Transfer of Care Note  Patient: Tamara Lawrence  Procedure(s) Performed: COLONOSCOPY WITH PROPOFOL (N/A ) ESOPHAGOGASTRODUODENOSCOPY (EGD) WITH PROPOFOL (N/A )  Patient Location: PACU and Endoscopy Unit  Anesthesia Type:General  Level of Consciousness: drowsy  Airway & Oxygen Therapy: Patient Spontanous Breathing  Post-op Assessment: Report given to RN and Post -op Vital signs reviewed and stable  Post vital signs: Reviewed and stable  Last Vitals:  Vitals Value Taken Time  BP 111/59 10/18/19 0954  Temp    Pulse 73 10/18/19 0956  Resp 19 10/18/19 0956  SpO2 98 % 10/18/19 0956  Vitals shown include unvalidated device data.  Last Pain:  Vitals:   10/18/19 0747  TempSrc: Temporal  PainSc: 0-No pain         Complications: No complications documented.

## 2019-10-19 ENCOUNTER — Encounter: Payer: Self-pay | Admitting: Gastroenterology

## 2019-10-19 LAB — SURGICAL PATHOLOGY

## 2019-10-22 ENCOUNTER — Encounter: Payer: Self-pay | Admitting: Gastroenterology

## 2019-11-01 ENCOUNTER — Other Ambulatory Visit: Payer: Self-pay

## 2019-11-02 ENCOUNTER — Ambulatory Visit (INDEPENDENT_AMBULATORY_CARE_PROVIDER_SITE_OTHER): Payer: Medicare Other | Admitting: Gastroenterology

## 2019-11-02 ENCOUNTER — Other Ambulatory Visit: Payer: Self-pay

## 2019-11-02 ENCOUNTER — Encounter: Payer: Self-pay | Admitting: Gastroenterology

## 2019-11-02 VITALS — BP 147/70 | HR 86 | Temp 98.2°F | Wt 171.4 lb

## 2019-11-02 DIAGNOSIS — K219 Gastro-esophageal reflux disease without esophagitis: Secondary | ICD-10-CM

## 2019-11-02 DIAGNOSIS — D126 Benign neoplasm of colon, unspecified: Secondary | ICD-10-CM | POA: Diagnosis not present

## 2019-11-02 NOTE — Progress Notes (Signed)
Tamara Darby, MD [address]  [place]  [place], [place] [address]  Main: [phone]  Fax: [phone]    Gastroenterology Consultation  Referring Provider:     Sofie Hartigan, MD Primary Care Physician:  Sofie Hartigan, MD Primary Gastroenterologist:  Dr. Cephas Lawrence Reason for Consultation:  chronic GERD        HPI:   Tamara Lawrence is a 74 y.o. female referred by Dr. Ellison Hughs, Chrissie Noa, MD  for consultation & management of colon cancer screening and chronic GERD.  Patient has a family history of colon cancer in her mother at age 64, survived for 5 years, had metastasis to the liver.  Patient has been undergoing surveillance colonoscopies every 5 years.  Her last one was in 2014 by Dr. Gustavo Lawrence.  Patient wanted to discuss about her colonoscopy before scheduling directly.  She was told that her colon was extremely tortuous and Dr. Gustavo Lawrence had to use pediatric colonoscope every time and had to use manual pressure to complete the procedure.  She had a bout of diverticulitis 2 weeks after her last colonoscopy.  Patient wanted to make sure that her concerns are addressed before scheduling colonoscopy.  That is why, she is here today.  She otherwise does not have constipation, rectal bleeding or abdominal pain.  She does have intermittent diarrhea since cholecystectomy in 2019.  Additionally, she does have heartburn for which she takes omeprazole 20 mg at bedtime for at least 10 years.  She never had an upper endoscopy.  She denies dysphagia. Her CBC and LFTs are normal in 09/2018  S/p lap chole in 01/10/2018 She does not smoke or drink alcohol  Follow-up visit 11/02/2019 Patient underwent upper endoscopy which revealed multiple fundic gland polyps, biopsied.  Otherwise unremarkable.  She is currently on omeprazole 20 mg daily.  She does have intermittent reflux symptoms of GERD triggered by certain foods.  NSAIDs: Ibuprofen as needed for musculoskeletal  pain  Antiplts/Anticoagulants/Anti thrombotics: None  GI Procedures: Colonoscopy 10/02/2012 by Dr Tamara Lawrence Diagnosis:  Part A: RECTO SIGMOID JUNCTION POLYP COLD BIOPSY:  - HYPERPLASTIC POLYP.  - NEGATIVE FOR DYSPLASIA AND MALIGNANCY.  .  Part B: RECTUM POLYP COLD SNARE:  - BENIGN COLONIC MUCOSA WITH FOCAL LYMPHOID AGGREGATE.  - NEGATIVE FOR DYSPLASIA AND MALIGNANCY.   EGD and colonoscopy 10/26/2019 - Normal duodenal bulb and second portion of the duodenum. - Multiple gastric polyps. Biopsied. - Esophagogastric landmarks identified. - Normal gastroesophageal junction and esophagus. - Normal incisura, antrum, prepyloric region of the stomach and pylorus. Biopsied. - Normal duodenal bulb and second portion of the duodenum.  - Enteroscope was used to reach cecum - Six 3 to 5 mm polyps in the transverse colon (1), in the cecum (4) and at the appendiceal orifice (1), removed with a cold snare. Resected and retrieved. - Diverticulosis in the recto-sigmoid colon, in the sigmoid colon and in the descending colon. - Non-bleeding external hemorrhoids.  DIAGNOSIS:  A. STOMACH, RANDOM; COLD BIOPSY:  - ANTRAL MUCOSA WITH REACTIVE GASTRITIS.  - OXYNTIC MUCOSA WITH CHANGES CONSISTENT WITH PROTON PUMP INHIBITOR USE.  - NEGATIVE FOR H. PYLORI, DYSPLASIA, AND MALIGNANCY.   B. STOMACH POLYPS, MULTIPLE; COLD BIOPSY:  - FUNDIC GLAND POLYPS.  - NEGATIVE FOR H. PYLORI, DYSPLASIA, AND MALIGNANCY.   C. COLON POLYP X4, CECUM; COLD SNARE:  - TUBULAR ADENOMA (MULTIPLE FRAGMENTS).  - NEGATIVE FOR HIGH-GRADE DYSPLASIA AND MALIGNANCY.   D. COLON POLYP, APPENDICEAL ORIFICE; COLD SNARE:  - TUBULAR  ADENOMA.  - NEGATIVE FOR HIGH-GRADE DYSPLASIA AND MALIGNANCY.   E. COLON POLYP, TRANSVERSE; COLD SNARE:  - TUBULAR ADENOMA.  - NEGATIVE FOR HIGH-GRADE DYSPLASIA AND MALIGNANCY.   Past Medical History:  Diagnosis Date  . GERD (gastroesophageal reflux disease)   . Hypertension     Past Surgical  History:  Procedure Laterality Date  . CHOLECYSTECTOMY N/A 01/10/2018   Procedure: LAPAROSCOPIC CHOLECYSTECTOMY;  Surgeon: Benjamine Sprague, DO;  Location: [facility] ORS;  Service: General;  Laterality: N/A;  . COLONOSCOPY WITH PROPOFOL N/A 10/18/2019   Procedure: COLONOSCOPY WITH PROPOFOL;  Surgeon: Lin Landsman, MD;  Location: [facility] ENDOSCOPY;  Service: Gastroenterology;  Laterality: N/A;  . ESOPHAGOGASTRODUODENOSCOPY (EGD) WITH PROPOFOL N/A 10/18/2019   Procedure: ESOPHAGOGASTRODUODENOSCOPY (EGD) WITH PROPOFOL;  Surgeon: Lin Landsman, MD;  Location: [facility] ENDOSCOPY;  Service: Gastroenterology;  Laterality: N/A;  . TONSILLECTOMY  1956    Current Outpatient Medications:  .  cetirizine (ZYRTEC) 10 MG tablet, Take 10 mg by mouth daily., Disp: , Rfl:  .  omeprazole (PRILOSEC) 20 MG capsule, Take 20 mg by mouth daily., Disp: , Rfl:  .  sertraline (ZOLOFT) 25 MG tablet, Take 25 mg by mouth daily., Disp: , Rfl:  .  sertraline (ZOLOFT) 50 MG tablet, Take 50 mg by mouth daily., Disp: , Rfl:    Family History  Problem Relation Age of Onset  . Breast cancer Maternal Aunt 51     Social History   Tobacco Use  . Smoking status: Former Smoker    Quit date: 1999    Years since quitting: 22.6  . Smokeless tobacco: Never Used  Vaping Use  . Vaping Use: Never used  Substance Use Topics  . Alcohol use: Yes    Comment: rare  . Drug use: Never    Allergies as of 11/02/2019 - Review Complete 11/02/2019  Allergen Reaction Noted  . Adhesive [tape] Rash 01/02/2018    Review of Systems:    All systems reviewed and negative except where noted in HPI.   Physical Exam:  BP (!) 147/70 (BP Location: Left Arm, Patient Position: Sitting, Cuff Size: Normal)   Pulse 86   Temp 98.2 F (36.8 C) (Oral)   Wt 171 lb 6 oz (77.7 kg)   BMI 31.34 kg/m  No LMP recorded. Patient is postmenopausal.  General:   Alert,  Well-developed, well-nourished, pleasant and cooperative in NAD Head:  Normocephalic and  atraumatic. Eyes:  Sclera clear, no icterus.   Conjunctiva pink. Ears:  Normal auditory acuity. Nose:  No deformity, discharge, or lesions. Mouth:  No deformity or lesions,oropharynx pink & moist. Neck:  Supple; no masses or thyromegaly. Lungs:  Respirations even and unlabored.  Clear throughout to auscultation.   No wheezes, crackles, or rhonchi. No acute distress. Heart:  Regular rate and rhythm; no murmurs, clicks, rubs, or gallops. Abdomen:  Normal bowel sounds. Soft, non-tender and non-distended without masses, hepatosplenomegaly or hernias noted.  No guarding or rebound tenderness.   Rectal: Not performed Msk:  Symmetrical without gross deformities. Good, equal movement & strength bilaterally. Pulses:  Normal pulses noted. Extremities:  No clubbing or edema.  No cyanosis. Neurologic:  Alert and oriented x3;  grossly normal neurologically. Skin:  Intact without significant lesions or rashes. No jaundice. Psych:  Alert and cooperative. Normal mood and affect.  Imaging Studies: Reviewed  Assessment and Plan:   Ettel Albergo is a 74 y.o. female with history of hypertension, chronic GERD   Chronic GERD:  EGD is unremarkable Discussed about  antireflux lifestyle, information provided via MyChart Continue omeprazole 20 mg daily or take Pepcid as needed  Multiple tubular adenomas of colon Recommend surveillance colonoscopy in 10/2022 with enteroscope  Follow up as needed   Tamara Darby, MD

## 2020-09-24 ENCOUNTER — Encounter: Payer: Self-pay | Admitting: Ophthalmology

## 2020-10-03 NOTE — Discharge Instructions (Signed)

## 2020-10-06 ENCOUNTER — Ambulatory Visit: Payer: Medicare Other | Admitting: Anesthesiology

## 2020-10-06 ENCOUNTER — Other Ambulatory Visit: Payer: Self-pay

## 2020-10-06 ENCOUNTER — Ambulatory Visit
Admission: RE | Admit: 2020-10-06 | Discharge: 2020-10-06 | Disposition: A | Discharge status: Home / Self Care | Payer: Medicare Other | Attending: Ophthalmology | Admitting: Ophthalmology

## 2020-10-06 ENCOUNTER — Encounter
Admission: RE | Disposition: A | Discharge status: Home / Self Care | Payer: Self-pay | Source: Home / Self Care | Attending: Ophthalmology

## 2020-10-06 ENCOUNTER — Encounter: Payer: Self-pay | Admitting: Ophthalmology

## 2020-10-06 DIAGNOSIS — Z79899 Other long term (current) drug therapy: Secondary | ICD-10-CM | POA: Diagnosis not present

## 2020-10-06 DIAGNOSIS — H2511 Age-related nuclear cataract, right eye: Secondary | ICD-10-CM | POA: Diagnosis not present

## 2020-10-06 DIAGNOSIS — Z87891 Personal history of nicotine dependence: Secondary | ICD-10-CM | POA: Insufficient documentation

## 2020-10-06 DIAGNOSIS — Z91048 Other nonmedicinal substance allergy status: Secondary | ICD-10-CM | POA: Insufficient documentation

## 2020-10-06 HISTORY — PX: CATARACT EXTRACTION W/PHACO: SHX586

## 2020-10-06 HISTORY — DX: Unspecified osteoarthritis, unspecified site: M19.90

## 2020-10-06 HISTORY — DX: Fibromyalgia: M79.7

## 2020-10-06 HISTORY — DX: Irritable bowel syndrome, unspecified: K58.9

## 2020-10-06 SURGERY — PHACOEMULSIFICATION, CATARACT, WITH IOL INSERTION
Anesthesia: Monitor Anesthesia Care | Site: Eye | Laterality: Right

## 2020-10-06 MED ORDER — TETRACAINE HCL 0.5 % OP SOLN
1.0000 [drp] | OPHTHALMIC | Status: DC | PRN
  Administered 2020-10-06 (×3): 1 [drp] via OPHTHALMIC

## 2020-10-06 MED ORDER — MOXIFLOXACIN HCL 0.5 % OP SOLN
OPHTHALMIC | Status: DC | PRN
  Administered 2020-10-06: 0.2 mL via OPHTHALMIC

## 2020-10-06 MED ORDER — LIDOCAINE HCL (PF) 2 % IJ SOLN
INTRAOCULAR | Status: DC | PRN
  Administered 2020-10-06: 1 mL via INTRAOCULAR

## 2020-10-06 MED ORDER — PHENYLEPHRINE HCL 2.5 % OP SOLN: 1.0000 [drp] | OPHTHALMIC | Status: DC | PRN

## 2020-10-06 MED ORDER — SIGHTPATH DOSE#1 BSS IO SOLN
INTRAOCULAR | Status: DC | PRN
  Administered 2020-10-06: 72 mL via OPHTHALMIC

## 2020-10-06 MED ORDER — FENTANYL CITRATE (PF) 100 MCG/2ML IJ SOLN
INTRAMUSCULAR | Status: DC | PRN
  Administered 2020-10-06 (×2): 50 ug via INTRAVENOUS

## 2020-10-06 MED ORDER — SIGHTPATH DOSE#1 BSS IO SOLN
INTRAOCULAR | Status: DC | PRN
  Administered 2020-10-06: 15 mL

## 2020-10-06 MED ORDER — SIGHTPATH DOSE#1 SODIUM HYALURONATE 23 MG/ML IO SOLUTION
PREFILLED_SYRINGE | INTRAOCULAR | Status: DC | PRN
  Administered 2020-10-06: .6 mL via INTRAOCULAR

## 2020-10-06 MED ORDER — CYCLOPENTOLATE HCL 2 % OP SOLN
1.0000 [drp] | OPHTHALMIC | Status: DC | PRN
  Administered 2020-10-06 (×3): 1 [drp] via OPHTHALMIC

## 2020-10-06 MED ORDER — MIDAZOLAM HCL 2 MG/2ML IJ SOLN
INTRAMUSCULAR | Status: DC | PRN
  Administered 2020-10-06: 2 mg via INTRAVENOUS

## 2020-10-06 MED ORDER — SIGHTPATH DOSE#1 SODIUM HYALURONATE 10 MG/ML IO SOLUTION
PREFILLED_SYRINGE | INTRAOCULAR | Status: DC | PRN
  Administered 2020-10-06: 0.55 mL via INTRAOCULAR

## 2020-10-06 MED ORDER — PHENYLEPHRINE HCL 10 % OP SOLN
1.0000 [drp] | OPHTHALMIC | Status: DC | PRN
  Administered 2020-10-06 (×3): 1 [drp] via OPHTHALMIC

## 2020-10-06 SURGICAL SUPPLY — 14 items
CANNULA ANT/CHMB 27GA (MISCELLANEOUS) ×4 IMPLANT
DISSECTOR HYDRO NUCLEUS 50X22 (MISCELLANEOUS) ×2 IMPLANT
GLOVE SURG ENC TEXT LTX SZ7.5 (GLOVE) ×2 IMPLANT
GLOVE SURG SYN 8.5  E (GLOVE) ×1
GLOVE SURG SYN 8.5 E (GLOVE) ×1 IMPLANT
GOWN STRL REUS W/ TWL LRG LVL3 (GOWN DISPOSABLE) ×2 IMPLANT
GOWN STRL REUS W/TWL LRG LVL3 (GOWN DISPOSABLE) ×4
LENS IOL TECNIS EYHANCE 20.5 (Intraocular Lens) ×2 IMPLANT
MARKER SKIN DUAL TIP RULER LAB (MISCELLANEOUS) ×2 IMPLANT
PACK EYE AFTER SURG (MISCELLANEOUS) ×2 IMPLANT
SYR 3ML LL SCALE MARK (SYRINGE) ×2 IMPLANT
SYR TB 1ML LUER SLIP (SYRINGE) ×2 IMPLANT
WATER STERILE IRR 250ML POUR (IV SOLUTION) ×2 IMPLANT
WIPE NON LINTING 3.25X3.25 (MISCELLANEOUS) ×2 IMPLANT

## 2020-10-06 NOTE — Transfer of Care (Signed)
Immediate Anesthesia Transfer of Care Note  Patient: Tamara Lawrence  Procedure(s) Performed: CATARACT EXTRACTION PHACO AND INTRAOCULAR LENS PLACEMENT (IOC) RIGHT 4.29 00:29.1 (Right: Eye)  Patient Location: PACU  Anesthesia Type: General, MAC  Level of Consciousness: awake, alert  and patient cooperative  Airway and Oxygen Therapy: Patient Spontanous Breathing and Patient connected to supplemental oxygen  Post-op Assessment: Post-op Vital signs reviewed, Patient's Cardiovascular Status Stable, Respiratory Function Stable, Patent Airway and No signs of Nausea or vomiting  Post-op Vital Signs: Reviewed and stable  Complications: No notable events documented.

## 2020-10-06 NOTE — H&P (Signed)
[facility]   Primary Care Physician:  Sofie Hartigan, MD Ophthalmologist: Dr. Benay Pillow  Pre-Procedure History & Physical: HPI:  Tamara Lawrence is a 75 y.o. female here for cataract surgery.   Past Medical History:  Diagnosis Date   Arthritis    Fibromyalgia    GERD (gastroesophageal reflux disease)    Hypertension    Irritable bowel syndrome (IBS)     Past Surgical History:  Procedure Laterality Date   CHOLECYSTECTOMY N/A 01/10/2018   Procedure: LAPAROSCOPIC CHOLECYSTECTOMY;  Surgeon: Benjamine Sprague, DO;  Location: [facility] ORS;  Service: General;  Laterality: N/A;   COLONOSCOPY WITH PROPOFOL N/A 10/18/2019   Procedure: COLONOSCOPY WITH PROPOFOL;  Surgeon: Lin Landsman, MD;  Location: [facility] ENDOSCOPY;  Service: Gastroenterology;  Laterality: N/A;   ESOPHAGOGASTRODUODENOSCOPY (EGD) WITH PROPOFOL N/A 10/18/2019   Procedure: ESOPHAGOGASTRODUODENOSCOPY (EGD) WITH PROPOFOL;  Surgeon: Lin Landsman, MD;  Location: [facility] ENDOSCOPY;  Service: Gastroenterology;  Laterality: N/A;   TONSILLECTOMY  1956    Prior to Admission medications   Medication Sig Start Date End Date Taking? Authorizing Provider  cetirizine (ZYRTEC) 10 MG tablet Take 10 mg by mouth daily.   Yes [provider]  omeprazole (PRILOSEC) 20 MG capsule Take 20 mg by mouth daily.   Yes [provider]  pregabalin (LYRICA) 25 MG capsule Take 25 mg by mouth 2 (two) times daily.   Yes [provider]  sertraline (ZOLOFT) 25 MG tablet Take 25 mg by mouth daily.   Yes [provider]  sertraline (ZOLOFT) 50 MG tablet Take 50 mg by mouth daily.   Yes [provider]    Allergies as of 08/06/2020 - Review Complete 11/02/2019  Allergen Reaction Noted   Adhesive [tape] Rash 01/02/2018    Family History  Problem Relation Age of Onset   Breast cancer Maternal Aunt 21    Social History   Socioeconomic History   Marital status: Single    Spouse name: Not on  file   Number of children: Not on file   Years of education: Not on file   Highest education level: Not on file  Occupational History   Not on file  Tobacco Use   Smoking status: Former    Types: Cigarettes    Quit date: 1999    Years since quitting: 23.5   Smokeless tobacco: Never  Vaping Use   Vaping Use: Never used  Substance and Sexual Activity   Alcohol use: Yes    Comment: rare   Drug use: Never   Sexual activity: Not on file  Other Topics Concern   Not on file  Social History Narrative   Not on file   Social Determinants of Health   Financial Resource Strain: Not on file  Food Insecurity: Not on file  Transportation Needs: Not on file  Physical Activity: Not on file  Stress: Not on file  Social Connections: Not on file  Intimate Partner Violence: Not on file    Review of Systems: See HPI, otherwise negative ROS  Physical Exam: BP (!) 168/72   Pulse 79   Temp 98.1 F (36.7 C) (Temporal)   Resp 18   Ht '5\' 2"'$  (1.575 m)   Wt 79.9 kg   SpO2 98%   BMI 32.21 kg/m  General:   Alert, cooperative in NAD Head:  Normocephalic and atraumatic. Respiratory:  Normal work of breathing. Cardiovascular:  RRR  Impression/Plan: Tamara Lawrence is here for cataract surgery.  Risks, benefits, limitations,  and alternatives regarding cataract surgery have been reviewed with the patient.  Questions have been answered.  All parties agreeable.   Benay Pillow, MD  10/06/2020, 11:49 AM

## 2020-10-06 NOTE — Op Note (Signed)
OPERATIVE NOTE  Tamara Lawrence [id number] 10/06/2020   PREOPERATIVE DIAGNOSIS:  Nuclear sclerotic cataract right eye.  H25.11   POSTOPERATIVE DIAGNOSIS:    Nuclear sclerotic cataract right eye.     PROCEDURE:  Phacoemusification with posterior chamber intraocular lens placement of the right eye   LENS:   Implant Name Type Inv. Item Serial No. Manufacturer Lot No. LRB No. Used Action  LENS IOL TECNIS EYHANCE 20.5 - [id number] Intraocular Lens LENS IOL TECNIS EYHANCE 20.5 [id number] [id number]   Right 1 Implanted       Procedure(s): CATARACT EXTRACTION PHACO AND INTRAOCULAR LENS PLACEMENT (IOC) RIGHT 4.29 00:29.1 (Right)  DIB00 +20.5   SURGEON:  Benay Pillow, MD, MPH  ANESTHESIOLOGIST: Anesthesiologist: Marice Potter, MD CRNA: Izetta Dakin, CRNA; Cameron Ali, CRNA   ANESTHESIA:  Topical with tetracaine drops augmented with 1% preservative-free intracameral lidocaine.  ESTIMATED BLOOD LOSS: less than 1 mL.   COMPLICATIONS:  None.   DESCRIPTION OF PROCEDURE:  The patient was identified in the holding room and transported to the operating room and placed in the supine position under the operating microscope.  The right eye was identified as the operative eye and it was prepped and draped in the usual sterile ophthalmic fashion.   A 1.0 millimeter clear-corneal paracentesis was made at the 10:30 position. 0.5 ml of preservative-free 1% lidocaine with epinephrine was injected into the anterior chamber.  The anterior chamber was filled with Healon 5 viscoelastic.  A 2.4 millimeter keratome was used to make a near-clear corneal incision at the 8:00 position.  A curvilinear capsulorrhexis was made with a cystotome and capsulorrhexis forceps.  Balanced salt solution was used to hydrodissect and hydrodelineate the nucleus.   Phacoemulsification was then used in stop and chop fashion to remove the lens nucleus and epinucleus.  The remaining cortex was then removed using the  irrigation and aspiration handpiece. Healon was then placed into the capsular bag to distend it for lens placement.  A lens was then injected into the capsular bag.  The remaining viscoelastic was aspirated.   Wounds were hydrated with balanced salt solution.  The anterior chamber was inflated to a physiologic pressure with balanced salt solution.   Intracameral vigamox 0.1 mL undiluted was injected into the eye and a drop placed onto the ocular surface.  No wound leaks were noted.  The patient was taken to the recovery room in stable condition without complications of anesthesia or surgery  Benay Pillow 10/06/2020, 12:18 PM

## 2020-10-06 NOTE — Anesthesia Preprocedure Evaluation (Signed)
Anesthesia Evaluation  Patient identified by MRN, date of birth, ID band Patient awake    Reviewed: Allergy & Precautions, H&P , NPO status , Patient's Chart, lab work & pertinent test results, reviewed documented beta blocker date and time   Airway Mallampati: II  TM Distance: >3 FB Neck ROM: full    Dental no notable dental hx.    Pulmonary neg pulmonary ROS, former smoker,    Pulmonary exam normal breath sounds clear to auscultation       Cardiovascular Exercise Tolerance: Good hypertension, negative cardio ROS   Rhythm:regular Rate:Normal     Neuro/Psych negative neurological ROS  negative psych ROS   GI/Hepatic Neg liver ROS, GERD  ,  Endo/Other  negative endocrine ROS  Renal/GU negative Renal ROS  negative genitourinary   Musculoskeletal   Abdominal   Peds  Hematology negative hematology ROS (+)   Anesthesia Other Findings   Reproductive/Obstetrics negative OB ROS                             Anesthesia Physical Anesthesia Plan  ASA: 2  Anesthesia Plan: General and MAC   Post-op Pain Management:    Induction:   PONV Risk Score and Plan: 3 and Midazolam  Airway Management Planned:   Additional Equipment:   Intra-op Plan:   Post-operative Plan:   Informed Consent: I have reviewed the patients History and Physical, chart, labs and discussed the procedure including the risks, benefits and alternatives for the proposed anesthesia with the patient or authorized representative who has indicated his/her understanding and acceptance.     Dental Advisory Given  Plan Discussed with: CRNA  Anesthesia Plan Comments:         Anesthesia Quick Evaluation

## 2020-10-06 NOTE — Anesthesia Postprocedure Evaluation (Signed)
Anesthesia Post Note  Patient: Tamara Lawrence  Procedure(s) Performed: CATARACT EXTRACTION PHACO AND INTRAOCULAR LENS PLACEMENT (IOC) RIGHT 4.29 00:29.1 (Right: Eye)     Patient location during evaluation: PACU Anesthesia Type: MAC Level of consciousness: awake and alert Pain management: pain level controlled Vital Signs Assessment: post-procedure vital signs reviewed and stable Respiratory status: spontaneous breathing, nonlabored ventilation, respiratory function stable and patient connected to nasal cannula oxygen Cardiovascular status: stable and blood pressure returned to baseline Postop Assessment: no apparent nausea or vomiting Anesthetic complications: no   No notable events documented.  Demetrus Pavao, Glade Stanford

## 2020-10-06 NOTE — Anesthesia Procedure Notes (Signed)
Procedure Name: MAC Date/Time: 10/06/2020 12:01 PM Performed by: Izetta Dakin, CRNA Pre-anesthesia Checklist: Patient identified, Emergency Drugs available, Suction available, Timeout performed and Patient being monitored Patient Re-evaluated:Patient Re-evaluated prior to induction Oxygen Delivery Method: Nasal cannula Placement Confirmation: positive ETCO2

## 2020-10-07 ENCOUNTER — Encounter: Payer: Self-pay | Admitting: Ophthalmology

## 2020-10-07 ENCOUNTER — Other Ambulatory Visit: Payer: Self-pay

## 2020-10-17 NOTE — Discharge Instructions (Signed)

## 2020-10-20 ENCOUNTER — Other Ambulatory Visit: Payer: Self-pay

## 2020-10-20 ENCOUNTER — Encounter
Admission: RE | Disposition: A | Discharge status: Home / Self Care | Payer: Self-pay | Source: Home / Self Care | Attending: Ophthalmology

## 2020-10-20 ENCOUNTER — Ambulatory Visit: Payer: Medicare Other | Admitting: Anesthesiology

## 2020-10-20 ENCOUNTER — Ambulatory Visit
Admission: RE | Admit: 2020-10-20 | Discharge: 2020-10-20 | Disposition: A | Discharge status: Home / Self Care | Payer: Medicare Other | Attending: Ophthalmology | Admitting: Ophthalmology

## 2020-10-20 ENCOUNTER — Encounter: Payer: Self-pay | Admitting: Ophthalmology

## 2020-10-20 DIAGNOSIS — Z87891 Personal history of nicotine dependence: Secondary | ICD-10-CM | POA: Insufficient documentation

## 2020-10-20 DIAGNOSIS — Z91048 Other nonmedicinal substance allergy status: Secondary | ICD-10-CM | POA: Diagnosis not present

## 2020-10-20 DIAGNOSIS — Z79899 Other long term (current) drug therapy: Secondary | ICD-10-CM | POA: Diagnosis not present

## 2020-10-20 DIAGNOSIS — H2512 Age-related nuclear cataract, left eye: Secondary | ICD-10-CM | POA: Insufficient documentation

## 2020-10-20 HISTORY — PX: CATARACT EXTRACTION W/PHACO: SHX586

## 2020-10-20 SURGERY — PHACOEMULSIFICATION, CATARACT, WITH IOL INSERTION
Anesthesia: Monitor Anesthesia Care | Site: Eye | Laterality: Left

## 2020-10-20 MED ORDER — CYCLOPENTOLATE HCL 2 % OP SOLN
1.0000 [drp] | OPHTHALMIC | Status: DC
  Administered 2020-10-20 (×3): 1 [drp] via OPHTHALMIC

## 2020-10-20 MED ORDER — SIGHTPATH DOSE#1 SODIUM HYALURONATE 10 MG/ML IO SOLUTION
PREFILLED_SYRINGE | INTRAOCULAR | Status: DC | PRN
  Administered 2020-10-20: 0.85 mL via INTRAOCULAR

## 2020-10-20 MED ORDER — MOXIFLOXACIN HCL 0.5 % OP SOLN
OPHTHALMIC | Status: DC | PRN
  Administered 2020-10-20: 0.2 mL via OPHTHALMIC

## 2020-10-20 MED ORDER — PHENYLEPHRINE HCL 10 % OP SOLN
1.0000 [drp] | OPHTHALMIC | Status: AC
Start: 2020-10-20 — End: 2020-10-20
  Administered 2020-10-20 (×3): 1 [drp] via OPHTHALMIC

## 2020-10-20 MED ORDER — FENTANYL CITRATE (PF) 100 MCG/2ML IJ SOLN
INTRAMUSCULAR | Status: DC | PRN
  Administered 2020-10-20: 50 ug via INTRAVENOUS

## 2020-10-20 MED ORDER — SIGHTPATH DOSE#1 BSS IO SOLN
INTRAOCULAR | Status: DC | PRN
  Administered 2020-10-20: 76 mL via OPHTHALMIC

## 2020-10-20 MED ORDER — TETRACAINE HCL 0.5 % OP SOLN
1.0000 [drp] | OPHTHALMIC | Status: DC | PRN
  Administered 2020-10-20 (×3): 1 [drp] via OPHTHALMIC

## 2020-10-20 MED ORDER — LIDOCAINE HCL (PF) 2 % IJ SOLN
INTRAOCULAR | Status: DC | PRN
  Administered 2020-10-20: 1 mL via INTRAOCULAR

## 2020-10-20 MED ORDER — SIGHTPATH DOSE#1 BSS IO SOLN
INTRAOCULAR | Status: DC | PRN
  Administered 2020-10-20: 15 mL

## 2020-10-20 MED ORDER — SIGHTPATH DOSE#1 SODIUM HYALURONATE 23 MG/ML IO SOLUTION
PREFILLED_SYRINGE | INTRAOCULAR | Status: DC | PRN
  Administered 2020-10-20: 0.55 mL via INTRAOCULAR

## 2020-10-20 MED ORDER — MIDAZOLAM HCL 2 MG/2ML IJ SOLN
INTRAMUSCULAR | Status: DC | PRN
  Administered 2020-10-20: 2 mg via INTRAVENOUS

## 2020-10-20 SURGICAL SUPPLY — 16 items
CANNULA ANT/CHMB 27GA (MISCELLANEOUS) ×4 IMPLANT
DISSECTOR HYDRO NUCLEUS 50X22 (MISCELLANEOUS) ×2 IMPLANT
GLOVE SURG ENC TEXT LTX SZ7.5 (GLOVE) ×2 IMPLANT
GLOVE SURG GAMMEX PI TX LF 7.5 (GLOVE) ×2 IMPLANT
GLOVE SURG SYN 8.5  E (GLOVE) ×1
GLOVE SURG SYN 8.5 E (GLOVE) ×1 IMPLANT
GOWN STRL REUS W/ TWL LRG LVL3 (GOWN DISPOSABLE) ×2 IMPLANT
GOWN STRL REUS W/TWL LRG LVL3 (GOWN DISPOSABLE) ×4
LENS IOL DIOP 20.5 (Intraocular Lens) ×2 IMPLANT
LENS IOL TECNIS MONO 20.5 (Intraocular Lens) ×1 IMPLANT
MARKER SKIN DUAL TIP RULER LAB (MISCELLANEOUS) ×2 IMPLANT
PACK EYE AFTER SURG (MISCELLANEOUS) ×2 IMPLANT
SYR 3ML LL SCALE MARK (SYRINGE) ×2 IMPLANT
SYR TB 1ML LUER SLIP (SYRINGE) ×2 IMPLANT
WATER STERILE IRR 250ML POUR (IV SOLUTION) ×2 IMPLANT
WIPE NON LINTING 3.25X3.25 (MISCELLANEOUS) ×2 IMPLANT

## 2020-10-20 NOTE — Op Note (Signed)
OPERATIVE NOTE  Tamara Lawrence [id number] 10/20/2020   PREOPERATIVE DIAGNOSIS:  Nuclear sclerotic cataract left eye.  H25.12   POSTOPERATIVE DIAGNOSIS:    Nuclear sclerotic cataract left eye.     PROCEDURE:  Phacoemusification with posterior chamber intraocular lens placement of the left eye   LENS:   Implant Name Type Inv. Item Serial No. Manufacturer Lot No. LRB No. Used Action  LENS IOL DIOP 20.5 - [id number] Intraocular Lens LENS IOL DIOP 20.5 [id number] [id number]   Left 1 Implanted      Procedure(s) with comments: CATARACT EXTRACTION PHACO AND INTRAOCULAR LENS PLACEMENT (IOC) LEFT (Left) - 4.45 00:33.0  DCB00 +20.5   SURGEON:  Benay Pillow, MD, MPH   ANESTHESIA:  Topical with tetracaine drops augmented with 1% preservative-free intracameral lidocaine.  ESTIMATED BLOOD LOSS: <1 mL   COMPLICATIONS:  None.   DESCRIPTION OF PROCEDURE:  The patient was identified in the holding room and transported to the operating room and placed in the supine position under the operating microscope.  The left eye was identified as the operative eye and it was prepped and draped in the usual sterile ophthalmic fashion.   A 1.0 millimeter clear-corneal paracentesis was made at the 5:00 position. 0.5 ml of preservative-free 1% lidocaine with epinephrine was injected into the anterior chamber.  The anterior chamber was filled with Healon 5 viscoelastic.  A 2.4 millimeter keratome was used to make a near-clear corneal incision at the 2:00 position.  A curvilinear capsulorrhexis was made with a cystotome and capsulorrhexis forceps.  Balanced salt solution was used to hydrodissect and hydrodelineate the nucleus.   Phacoemulsification was then used in stop and chop fashion to remove the lens nucleus and epinucleus.  The remaining cortex was then removed using the irrigation and aspiration handpiece. Healon was then placed into the capsular bag to distend it for lens placement.  A lens was then  injected into the capsular bag.  The remaining viscoelastic was aspirated.   Wounds were hydrated with balanced salt solution.  The anterior chamber was inflated to a physiologic pressure with balanced salt solution.  Intracameral vigamox 0.1 mL undiltued was injected into the eye and a drop placed onto the ocular surface.  No wound leaks were noted.  The patient was taken to the recovery room in stable condition without complications of anesthesia or surgery  Benay Pillow 10/20/2020, 1:04 PM

## 2020-10-20 NOTE — Anesthesia Preprocedure Evaluation (Signed)
Anesthesia Evaluation  Patient identified by MRN, date of birth, ID band Patient awake    Reviewed: Allergy & Precautions, NPO status , Patient's Chart, lab work & pertinent test results  History of Anesthesia Complications (+) history of anesthetic complications  Airway Mallampati: II  TM Distance: >3 FB Neck ROM: Full    Dental no notable dental hx.    Pulmonary former smoker,    Pulmonary exam normal breath sounds clear to auscultation       Cardiovascular Exercise Tolerance: Good hypertension, Normal cardiovascular exam Rhythm:Regular Rate:Normal     Neuro/Psych  Neuromuscular disease negative psych ROS   GI/Hepatic Neg liver ROS, GERD  ,  Endo/Other  negative endocrine ROS  Renal/GU negative Renal ROS  negative genitourinary   Musculoskeletal  (+) Arthritis , Fibromyalgia -  Abdominal (+) + obese,  Abdomen: soft.    Peds negative pediatric ROS (+)  Hematology negative hematology ROS (+)   Anesthesia Other Findings   Reproductive/Obstetrics negative OB ROS                             Anesthesia Physical Anesthesia Plan  ASA: 3  Anesthesia Plan: MAC   Post-op Pain Management:    Induction: Intravenous  PONV Risk Score and Plan: 2 and Treatment may vary due to age or medical condition and Midazolam  Airway Management Planned: Nasal Cannula and Natural Airway  Additional Equipment:   Intra-op Plan:   Post-operative Plan: Extubation in OR  Informed Consent: I have reviewed the patients History and Physical, chart, labs and discussed the procedure including the risks, benefits and alternatives for the proposed anesthesia with the patient or authorized representative who has indicated his/her understanding and acceptance.     Dental advisory given  Plan Discussed with: CRNA  Anesthesia Plan Comments:         Anesthesia Quick Evaluation                                  Patient Active Problem List   Diagnosis Date Noted  . Chronic GERD 03/28/2019  . Incomplete tear of left rotator cuff 06/22/2017  . Rotator cuff tendinitis, left 06/01/2017  . Degenerative tear of medial meniscus of left knee 05/18/2017  . Obesity (BMI 30.0-34.9) 04/20/2017  . Chronic shoulder bursitis, left 06/17/2016  . Elevated rheumatoid factor 05/10/2016  . Multiple joint pain 05/10/2016  . Seasonal allergic rhinitis 03/18/2014    No flowsheet data found. No flowsheet data found.  Risks and benefits of anesthesia discussed at length, patient or surrogate demonstrates understanding. Appropriately NPO. Plan to proceed with anesthesia.  Champ Mungo, MD 10/20/20

## 2020-10-20 NOTE — Anesthesia Postprocedure Evaluation (Signed)
Anesthesia Post Note  Patient: Tamara Lawrence  Procedure(s) Performed: CATARACT EXTRACTION PHACO AND INTRAOCULAR LENS PLACEMENT (IOC) LEFT (Left: Eye)     Patient location during evaluation: PACU Anesthesia Type: MAC Level of consciousness: awake and alert Pain management: pain level controlled Vital Signs Assessment: post-procedure vital signs reviewed and stable Respiratory status: spontaneous breathing, nonlabored ventilation, respiratory function stable and patient connected to nasal cannula oxygen Cardiovascular status: stable and blood pressure returned to baseline Postop Assessment: no apparent nausea or vomiting Anesthetic complications: no   No notable events documented.  Sinda Du

## 2020-10-20 NOTE — Anesthesia Procedure Notes (Signed)
Date/Time: 10/20/2020 12:47 PM Performed by: Mayme Genta, CRNA Pre-anesthesia Checklist: Patient identified, Emergency Drugs available, Suction available, Timeout performed and Patient being monitored Patient Re-evaluated:Patient Re-evaluated prior to induction Oxygen Delivery Method: Nasal cannula Placement Confirmation: positive ETCO2

## 2020-10-20 NOTE — Transfer of Care (Signed)
Immediate Anesthesia Transfer of Care Note  Patient: Tamara Lawrence  Procedure(s) Performed: CATARACT EXTRACTION PHACO AND INTRAOCULAR LENS PLACEMENT (IOC) LEFT (Left: Eye)  Patient Location: PACU  Anesthesia Type: MAC  Level of Consciousness: awake, alert  and patient cooperative  Airway and Oxygen Therapy: Patient Spontanous Breathing and Patient connected to supplemental oxygen  Post-op Assessment: Post-op Vital signs reviewed, Patient's Cardiovascular Status Stable, Respiratory Function Stable, Patent Airway and No signs of Nausea or vomiting  Post-op Vital Signs: Reviewed and stable  Complications: No notable events documented.

## 2020-10-20 NOTE — H&P (Signed)
[facility]   Primary Care Physician:  Sofie Hartigan, MD Ophthalmologist: Dr. Benay Pillow  Pre-Procedure History & Physical: HPI:  Tamara Lawrence is a 75 y.o. female here for cataract surgery.   Past Medical History:  Diagnosis Date   Arthritis    Fibromyalgia    GERD (gastroesophageal reflux disease)    Hypertension    Irritable bowel syndrome (IBS)     Past Surgical History:  Procedure Laterality Date   CATARACT EXTRACTION W/PHACO Right 10/06/2020   Procedure: CATARACT EXTRACTION PHACO AND INTRAOCULAR LENS PLACEMENT (IOC) RIGHT 4.29 00:29.1;  Surgeon: Eulogio Bear, MD;  Location: [place];  Service: Ophthalmology;  Laterality: Right;   CHOLECYSTECTOMY N/A 01/10/2018   Procedure: LAPAROSCOPIC CHOLECYSTECTOMY;  Surgeon: Benjamine Sprague, DO;  Location: [facility];  Service: General;  Laterality: N/A;   COLONOSCOPY WITH PROPOFOL N/A 10/18/2019   Procedure: COLONOSCOPY WITH PROPOFOL;  Surgeon: Lin Landsman, MD;  Location: [facility]COPY;  Service: Gastroenterology;  Laterality: N/A;   ESOPHAGOGASTRODUODENOSCOPY (EGD) WITH PROPOFOL N/A 10/18/2019   Procedure: ESOPHAGOGASTRODUODENOSCOPY (EGD) WITH PROPOFOL;  Surgeon: Lin Landsman, MD;  [facility]COPY;  Service: Gastroenterology;  Laterality: N/A;   TONSILLECTOMY  1956    Prior to Admission medications   Medication Sig Start Date End Date Taking? Authorizing Provider  cetirizine (ZYRTEC) 10 MG tablet Take 10 mg by mouth daily.   Yes [provider]  omeprazole (PRILOSEC) 20 MG capsule Take 20 mg by mouth daily.   Yes [provider]  pregabalin (LYRICA) 25 MG capsule Take 25 mg by mouth 2 (two) times daily.   Yes [provider]  sertraline (ZOLOFT) 25 MG tablet Take 25 mg by mouth daily.   Yes [provider]  sertraline (ZOLOFT) 50 MG tablet Take 50 mg by mouth daily.   Yes [provider]    Allergies as of 08/06/2020 - Review Complete  11/02/2019  Allergen Reaction Noted   Adhesive [tape] Rash 01/02/2018    Family History  Problem Relation Age of Onset   Breast cancer Maternal Aunt 54    Social History   Socioeconomic History   Marital status: Single    Spouse name: Not on file   Number of children: Not on file   Years of education: Not on file   Highest education level: Not on file  Occupational History   Not on file  Tobacco Use   Smoking status: Former    Types: Cigarettes    Quit date: 1999    Years since quitting: 23.6   Smokeless tobacco: Never  Vaping Use   Vaping Use: Never used  Substance and Sexual Activity   Alcohol use: Yes    Comment: rare   Drug use: Never   Sexual activity: Not on file  Other Topics Concern   Not on file  Social History Narrative   Not on file   Social Determinants of Health   Financial Resource Strain: Not on file  Food Insecurity: Not on file  Transportation Needs: Not on file  Physical Activity: Not on file  Stress: Not on file  Social Connections: Not on file  Intimate Partner Violence: Not on file    Review of Systems: See HPI, otherwise negative ROS  Physical Exam: BP 131/69   Pulse 76   Temp 97.6 F (36.4 C) (Temporal)   Wt 78.5 kg   SpO2 96%   BMI 31.64 kg/m  General:   Alert, cooperative in NAD Head:  Normocephalic and atraumatic. Respiratory:  Normal work of breathing. Cardiovascular:  RRR  Impression/Plan: Tamara Lawrence is here for cataract surgery.  Risks, benefits, limitations, and alternatives regarding cataract surgery have been reviewed with the patient.  Questions have been answered.  All parties agreeable.   Benay Pillow, MD  10/20/2020, 12:33 PM

## 2020-10-21 ENCOUNTER — Encounter: Payer: Self-pay | Admitting: Ophthalmology

## 2021-07-28 ENCOUNTER — Other Ambulatory Visit: Payer: Self-pay | Admitting: Orthopedic Surgery

## 2021-07-28 DIAGNOSIS — G8929 Other chronic pain: Secondary | ICD-10-CM

## 2021-07-28 DIAGNOSIS — M4807 Spinal stenosis, lumbosacral region: Secondary | ICD-10-CM

## 2021-08-12 ENCOUNTER — Ambulatory Visit
Admission: RE | Admit: 2021-08-12 | Discharge: 2021-08-12 | Disposition: A | Discharge status: Home / Self Care | Payer: Medicare Other | Source: Ambulatory Visit | Attending: Orthopedic Surgery | Admitting: Orthopedic Surgery

## 2021-08-12 DIAGNOSIS — M5442 Lumbago with sciatica, left side: Secondary | ICD-10-CM | POA: Insufficient documentation

## 2021-08-12 DIAGNOSIS — G8929 Other chronic pain: Secondary | ICD-10-CM | POA: Insufficient documentation

## 2021-08-12 DIAGNOSIS — M4807 Spinal stenosis, lumbosacral region: Secondary | ICD-10-CM | POA: Insufficient documentation

## 2022-03-19 ENCOUNTER — Ambulatory Visit
Admission: EM | Admit: 2022-03-19 | Discharge: 2022-03-19 | Disposition: A | Discharge status: Home / Self Care | Payer: Medicare Other | Attending: Family Medicine | Admitting: Family Medicine

## 2022-03-19 ENCOUNTER — Encounter: Payer: Self-pay | Admitting: Emergency Medicine

## 2022-03-19 DIAGNOSIS — R21 Rash and other nonspecific skin eruption: Secondary | ICD-10-CM | POA: Diagnosis not present

## 2022-03-19 MED ORDER — TRIAMCINOLONE ACETONIDE 0.1 % EX OINT: 1.0000 | TOPICAL_OINTMENT | Freq: Two times a day (BID) | CUTANEOUS | 0 refills | Status: AC

## 2022-03-19 MED ORDER — TRIAMCINOLONE ACETONIDE 0.1 % EX OINT: 1.0000 | TOPICAL_OINTMENT | Freq: Two times a day (BID) | CUTANEOUS | 0 refills | Status: DC

## 2022-03-19 MED ORDER — DOXYCYCLINE HYCLATE 100 MG PO CAPS: 100.0000 mg | ORAL_CAPSULE | Freq: Two times a day (BID) | ORAL | 0 refills | Status: AC

## 2022-03-19 MED ORDER — DOXYCYCLINE HYCLATE 100 MG PO CAPS: 100.0000 mg | ORAL_CAPSULE | Freq: Two times a day (BID) | ORAL | 0 refills | Status: DC

## 2022-03-19 NOTE — Discharge Instructions (Signed)
Stop by the pharmacy to pick up your prescriptions.  Follow up with your primary care provider, if symptoms do not improve.

## 2022-03-19 NOTE — ED Provider Notes (Signed)
[facility]    CSN: [id number] Arrival date & time: 03/19/22  1753      History   Chief Complaint Chief Complaint  Patient presents with   Rash    HPI Tamara Lawrence is a 77 y.o. female.   HPI  Tamara Lawrence presents for right thigh rash that started today.  States that she started itching on her right lateral thigh and then noticed that it was red and warm.  She denies any known bug bites.  She took a Benadryl prior to arrival that helped somewhat with the itching but it is still itching.  She has not had any new lesions show up, chest pain, shortness of breath, nausea, vomiting, diarrhea, fever or belly pain.   Past Medical History:  Diagnosis Date   Arthritis    Fibromyalgia    GERD (gastroesophageal reflux disease)    Hypertension    Irritable bowel syndrome (IBS)     Patient Active Problem List   Diagnosis Date Noted   Chronic GERD 03/28/2019   Incomplete tear of left rotator cuff 06/22/2017   Rotator cuff tendinitis, left 06/01/2017   Degenerative tear of medial meniscus of left knee 05/18/2017   Obesity (BMI 30.0-34.9) 04/20/2017   Chronic shoulder bursitis, left 06/17/2016   Elevated rheumatoid factor 05/10/2016   Multiple joint pain 05/10/2016   Seasonal allergic rhinitis 03/18/2014    Past Surgical History:  Procedure Laterality Date   CATARACT EXTRACTION W/PHACO Right 10/06/2020   Procedure: CATARACT EXTRACTION PHACO AND INTRAOCULAR LENS PLACEMENT (IOC) RIGHT 4.29 00:29.1;  Surgeon: Eulogio Bear, MD;  Location: [place];  Service: Ophthalmology;  Laterality: Right;   CATARACT EXTRACTION W/PHACO Left 10/20/2020   Procedure: CATARACT EXTRACTION PHACO AND INTRAOCULAR LENS PLACEMENT ([place]) LEFT;  Surgeon: Eulogio Bear, MD;  Location: [place];  Service: Ophthalmology;  Laterality: Left;  4.45 00:33.0   CHOLECYSTECTOMY N/A 01/10/2018   Procedure: LAPAROSCOPIC CHOLECYSTECTOMY;  Surgeon: Benjamine Sprague, DO;  Location:  [facility];  Service: General;  Laterality: N/A;   COLONOSCOPY WITH PROPOFOL N/A 10/18/2019   Procedure: COLONOSCOPY WITH PROPOFOL;  Surgeon: Lin Landsman, MD;  Location: [facility] ENDOSCOPY;  Service: Gastroenterology;  Laterality: N/A;   ESOPHAGOGASTRODUODENOSCOPY (EGD) WITH PROPOFOL N/A 10/18/2019   Procedure: ESOPHAGOGASTRODUODENOSCOPY (EGD) WITH PROPOFOL;  Surgeon: Lin Landsman, MD;  Location[facility] ENDOSCOPY;  Service: Gastroenterology;  Laterality: N/A;   TONSILLECTOMY  1956    OB History   No obstetric history on file.      Home Medications    Prior to Admission medications   Medication Sig Start Date End Date Taking? Authorizing Provider  omeprazole (PRILOSEC) 20 MG capsule Take 20 mg by mouth daily.   Yes [provider]  pregabalin (LYRICA) 25 MG capsule Take 25 mg by mouth 2 (two) times daily.   Yes [provider]  sertraline (ZOLOFT) 25 MG tablet Take 25 mg by mouth daily.   Yes [provider]  sertraline (ZOLOFT) 50 MG tablet Take 50 mg by mouth daily.   Yes [provider]  cetirizine (ZYRTEC) 10 MG tablet Take 10 mg by mouth daily.    [provider]  doxycycline (VIBRAMYCIN) 100 MG capsule Take 1 capsule (100 mg total) by mouth 2 (two) times daily for 7 days. 03/19/22 03/26/22  Lyndee Hensen, DO  triamcinolone ointment (KENALOG) 0.1 % Apply 1 Application topically 2 (two) times daily. 03/19/22   Lyndee Hensen, DO    Family History Family History  Problem  Relation Age of Onset   Breast cancer Maternal Aunt 1    Social History Social History   Tobacco Use   Smoking status: Former    Types: Cigarettes    Quit date: 1999    Years since quitting: 25.0   Smokeless tobacco: Never  Vaping Use   Vaping Use: Never used  Substance Use Topics   Alcohol use: Yes    Comment: rare   Drug use: Never     Allergies   Adhesive [tape]   Review of Systems Review of Systems :negative unless otherwise stated in  HPI.      Physical Exam Triage Vital Signs ED Triage Vitals  Enc Vitals Group     BP 03/19/22 1833 136/81     Pulse Rate 03/19/22 1833 77     Resp 03/19/22 1833 14     Temp 03/19/22 1833 98.4 F (36.9 C)     Temp Source 03/19/22 1833 Oral     SpO2 03/19/22 1833 97 %     Weight 03/19/22 1831 173 lb 1 oz (78.5 kg)     Height 03/19/22 1831 '5\' 2"'$  (1.575 m)     Head Circumference --      Peak Flow --      Pain Score 03/19/22 1831 4     Pain Loc --      Pain Edu? --      Excl. in [place]? --    No data found.  Updated Vital Signs BP 136/81 (BP Location: Right Arm)   Pulse 77   Temp 98.4 F (36.9 C) (Oral)   Resp 14   Ht '5\' 2"'$  (1.575 m)   Wt 78.5 kg   SpO2 97%   BMI 31.65 kg/m   Visual Acuity Right Eye Distance:   Left Eye Distance:   Bilateral Distance:    Right Eye Near:   Left Eye Near:    Bilateral Near:     Physical Exam  GEN: alert, well appearing female, in no acute distress  EYES:  no scleral injection CV: regular rate  RESP: no increased work of breathing MSK: baseline right hip ROM NEURO: alert, moves all extremities appropriately PSYCH: Normal affect, appropriate speech and behavior  SKIN: dry; right lateral thigh large erythematous patch with skin dimpling and warmth, without fluctuance or discharge   UC Treatments / Results  Labs (all labs ordered are listed, but only abnormal results are displayed) Labs Reviewed - No data to display  EKG   Radiology No results found.  Procedures Procedures (including critical care time)  Medications Ordered in UC Medications - No data to display  Initial Impression / Assessment and Plan / UC Course  I have reviewed the triage vital signs and the nursing notes.  Pertinent labs & imaging results that were available during my care of the patient were reviewed by me and considered in my medical decision making (see chart for details).     Patient is a 77 y.o. female who presents for acute right thigh  rash.  Overall, patient is well-appearing and well-hydrated.  Vital signs stable.  [place] is afebrile.  Exam concerning for contact dermatitis vs cellulitis.  Treat antibiotics and topical steroids as below.   Reviewed expectations regarding course of current medical issues.  All questions asked were answered.  Outlined signs and symptoms indicating need for more acute intervention. Patient verbalized understanding. After Visit Summary given.   Final Clinical Impressions(s) / UC Diagnoses   Final diagnoses:  Rash  Discharge Instructions      Stop by the pharmacy to pick up your prescriptions.  Follow up with your primary care provider, if symptoms do not improve.      ED Prescriptions     Medication Sig Dispense Auth. Provider   doxycycline (VIBRAMYCIN) 100 MG capsule Take 1 capsule (100 mg total) by mouth 2 (two) times daily for 7 days. 14 capsule Daylon Lafavor, DO   triamcinolone ointment (KENALOG) 0.1 % Apply 1 Application topically 2 (two) times daily. 30 g Lyndee Hensen, DO      PDMP not reviewed this encounter.              Lyndee Hensen, DO 03/22/22 1645

## 2022-03-19 NOTE — ED Triage Notes (Signed)
Patient states that later today states that she started having itching on her right upper thigh.  Paitent states that when she got home and looked at the area it was red, swollen and warm and tender to the touch.  Patient took 1 tablet of Benadryl.

## 2023-08-26 ENCOUNTER — Ambulatory Visit: Admission: EM | Admit: 2023-08-26 | Discharge: 2023-08-26 | Disposition: A | Discharge status: Home / Self Care

## 2023-08-26 ENCOUNTER — Encounter: Payer: Self-pay | Admitting: Emergency Medicine

## 2023-08-26 ENCOUNTER — Ambulatory Visit

## 2023-08-26 DIAGNOSIS — W19XXXA Unspecified fall, initial encounter: Secondary | ICD-10-CM | POA: Diagnosis not present

## 2023-08-26 DIAGNOSIS — I10 Essential (primary) hypertension: Secondary | ICD-10-CM

## 2023-08-26 DIAGNOSIS — M79641 Pain in right hand: Secondary | ICD-10-CM | POA: Diagnosis not present

## 2023-08-26 DIAGNOSIS — T07XXXA Unspecified multiple injuries, initial encounter: Secondary | ICD-10-CM

## 2023-08-26 DIAGNOSIS — S63501A Unspecified sprain of right wrist, initial encounter: Secondary | ICD-10-CM

## 2023-08-26 DIAGNOSIS — M25551 Pain in right hip: Secondary | ICD-10-CM

## 2023-08-26 MED ORDER — KETOROLAC TROMETHAMINE 30 MG/ML IJ SOLN
30.0000 mg | Freq: Once | INTRAMUSCULAR | Status: AC
Start: 2023-08-26 — End: 2023-08-26
  Administered 2023-08-26: 30 mg via INTRAMUSCULAR

## 2023-08-26 NOTE — ED Triage Notes (Addendum)
 Pt fell just prior to arrival. Pt is c/o right hip pain, right hand, right elbow. She states she could walk before her fall and she can't now. She did not hit her head or loss conscience ness. She states she tripped over a wire in the garage and landed on her right hip.

## 2023-08-26 NOTE — Discharge Instructions (Signed)
-   I did not see any obvious fractures.  If the radiologist reads any fractures tomorrow I will contact you right away. - Wear the brace as needed for discomfort.  If it is broken you will need to wear it longer and follow-up with orthopedics but I will contact you tomorrow if it is broken. -If for some reason I missed a fracture in your hip we will contact you and you will need to follow-up immediately with orthopedics or in the ER. - We gave you a Toradol injection tonight for pain relief.  Continue Lyrica and Tylenol  at home.  Ibuprofen  as needed or Aleve.

## 2023-08-26 NOTE — ED Provider Notes (Signed)
 [facility]    CSN: [id number] Arrival date & time: 08/26/23  1944      History   Chief Complaint Chief Complaint  Patient presents with   Fall    HPI Tamara Lawrence is a 78 y.o. female ending with family for multiple injuries following an accidental fall that happened at [facility] when she tripped over something.  She was wearing flip-flops.  This occurred immediately prior to arrival to urgent care.  The patient is complaining of right hip pain.  She is able to bear weight and walk but it hurts significantly to do so.  Reports pain in her right hand, wrist, forearm and elbow.  Has bruising of her wrist and hand.  Patient has a history of chronic shoulder and back pain.  History of fibromyalgia.  Pain is managed with Lyrica.  No previous hip fractures.  Patient denies head injury or loss of consciousness.  HPI  Past Medical History:  Diagnosis Date   Arthritis    Fibromyalgia    GERD (gastroesophageal reflux disease)    Hypertension    Irritable bowel syndrome (IBS)     Patient Active Problem List   Diagnosis Date Noted   Chronic GERD 03/28/2019   Incomplete tear of left rotator cuff 06/22/2017   Rotator cuff tendinitis, left 06/01/2017   Degenerative tear of medial meniscus of left knee 05/18/2017   Obesity (BMI 30.0-34.9) 04/20/2017   Chronic shoulder bursitis, left 06/17/2016   Elevated rheumatoid factor 05/10/2016   Multiple joint pain 05/10/2016   Seasonal allergic rhinitis 03/18/2014    Past Surgical History:  Procedure Laterality Date   CATARACT EXTRACTION W/PHACO Right 10/06/2020   Procedure: CATARACT EXTRACTION PHACO AND INTRAOCULAR LENS PLACEMENT (IOC) RIGHT 4.29 00:29.1;  Surgeon: Myrna Adine Anes, MD;  Location: [facility];  Service: Ophthalmology;  Laterality: Right;   CATARACT EXTRACTION W/PHACO Left 10/20/2020   Procedure: CATARACT EXTRACTION PHACO AND INTRAOCULAR LENS PLACEMENT (IOC) LEFT;  Surgeon: Myrna Adine Anes, MD;   Location: [facility];  Service: Ophthalmology;  Laterality: Left;  4.45 00:33.0   CHOLECYSTECTOMY N/A 01/10/2018   Procedure: LAPAROSCOPIC CHOLECYSTECTOMY;  Surgeon: Tye Millet, DO;  Location: [facility] ORS;  Service: General;  Laterality: N/A;   COLONOSCOPY WITH PROPOFOL  N/A 10/18/2019   Procedure: COLONOSCOPY WITH PROPOFOL ;  Surgeon: Unk Corinn Skiff, MD;  Location: [facility] END[facility];  Service: Gastroenterology;  Laterality: N/A;   ESOPHAGOGASTRODUODENOSCOPY (EGD) WITH PROPOFOL  N/A 10/18/2019   Procedure: ESOPHAGOGASTRODUODENOSCOPY (EGD) WITH PROPOFOL ;  Surgeon: Unk Corinn Skiff, MD;  Location: [facility] END[facility]COPY;  Service: Gastroenterology;  Laterality: N/A;   TONSILLECTOMY  1956    OB History   No obstetric history on file.      Home Medications    Prior to Admission medications   Medication Sig Start Date End Date Taking? Authorizing Provider  omeprazole (PRILOSEC) 20 MG capsule Take 20 mg by mouth daily.   Yes [provider]  pregabalin (LYRICA) 25 MG capsule Take 25 mg by mouth 2 (two) times daily.   Yes [provider]  sertraline (ZOLOFT) 25 MG tablet Take 25 mg by mouth daily.   Yes [provider]  sertraline (ZOLOFT) 50 MG tablet Take 50 mg by mouth daily.   Yes [provider]  cetirizine (ZYRTEC) 10 MG tablet Take 10 mg by mouth daily.    [provider]  Multiple Vitamins-Minerals (ICAPS AREDS 2 PO) Take 1 capsule by mouth.    [provider]  triamcinolone  ointment (KENALOG ) 0.1 %  Apply 1 Application topically 2 (two) times daily. 03/19/22   Kriste Berth, DO    Family History Family History  Problem Relation Age of Onset   Breast cancer Maternal Aunt 25    Social History Social History   Tobacco Use   Smoking status: Former    Current packs/day: 0.00    Types: Cigarettes    Quit date: 1999    Years since quitting: 26.4   Smokeless tobacco: Never  Vaping Use   Vaping status: Never Used  Substance  Use Topics   Alcohol use: Yes    Comment: rare   Drug use: Never     Allergies   Adhesive [tape]   Review of Systems Review of Systems  Constitutional:  Negative for fatigue.  Respiratory:  Negative for shortness of breath.   Cardiovascular:  Negative for chest pain.  Musculoskeletal:  Positive for arthralgias and gait problem. Negative for joint swelling.  Skin:  Positive for color change. Negative for wound.  Neurological:  Negative for dizziness, weakness, numbness and headaches.     Physical Exam Triage Vital Signs ED Triage Vitals  Encounter Vitals Group     BP --      Girls Systolic BP Percentile --      Girls Diastolic BP Percentile --      Boys Systolic BP Percentile --      Boys Diastolic BP Percentile --      Pulse --      Resp --      Temp --      Temp src --      SpO2 --      Weight 08/26/23 1958 173 lb 1 oz (78.5 kg)     Height 08/26/23 1958 5' 2 (1.575 m)     Head Circumference --      Peak Flow --      Pain Score 08/26/23 1959 8     Pain Loc --      Pain Education --      Exclude from Growth Chart --    No data found.  Updated Vital Signs BP (!) 174/78 (BP Location: Left Arm)   Pulse 77   Temp 98.4 F (36.9 C) (Oral)   Resp 16   Ht 5' 2 (1.575 m)   Wt 173 lb 1 oz (78.5 kg)   SpO2 96%   BMI 31.65 kg/m   Physical Exam Vitals and nursing note reviewed.  Constitutional:      General: She is not in acute distress.    Appearance: Normal appearance. She is not ill-appearing or toxic-appearing.  HENT:     Head: Normocephalic and atraumatic.   Eyes:     General: No scleral icterus.       Right eye: No discharge.        Left eye: No discharge.     Conjunctiva/sclera: Conjunctivae normal.    Cardiovascular:     Rate and Rhythm: Normal rate and regular rhythm.     Heart sounds: Normal heart sounds.  Pulmonary:     Effort: Pulmonary effort is normal. No respiratory distress.     Breath sounds: Normal breath sounds.    Musculoskeletal:     Cervical back: Neck supple.     Comments: RIGHT HIP: TTP lateral hip. Full ROM of hip.   RIGHT WRIST: Mild contusion. TTP distal radius. Mildly reduced ROM.   RIGHT HAND: Contusion of dorsal radial hand. TTP 5th metacarpal, 5th MCP and proximal 5th phalanx  No tenderness of forearm or elbow and full range of motion of elbow.   Skin:    General: Skin is dry.   Neurological:     General: No focal deficit present.     Mental Status: She is alert. Mental status is at baseline.     Motor: No weakness.     Gait: Gait abnormal.   Psychiatric:        Mood and Affect: Mood normal.        Behavior: Behavior normal.      UC Treatments / Results  Labs (all labs ordered are listed, but only abnormal results are displayed) Labs Reviewed - No data to display  EKG   Radiology DG Wrist Complete Right Result Date: 08/26/2023 CLINICAL DATA:  Fall, right wrist pain EXAM: RIGHT WRIST - COMPLETE 3+ VIEW COMPARISON:  None Available. FINDINGS: There is no evidence of fracture or dislocation. There is no evidence of arthropathy or other focal bone abnormality. Soft tissues are unremarkable. IMPRESSION: Negative. Electronically Signed   By: Dorethia Molt M.D.   On: 08/26/2023 20:38   DG Hip Unilat W or Wo Pelvis 2-3 Views Right Result Date: 08/26/2023 CLINICAL DATA:  Fall, right hip pain EXAM: DG HIP (WITH OR WITHOUT PELVIS) 2-3V RIGHT COMPARISON:  None Available. FINDINGS: There is no evidence of hip fracture or dislocation. There is no evidence of arthropathy or other focal bone abnormality. IMPRESSION: Negative. Electronically Signed   By: Dorethia Molt M.D.   On: 08/26/2023 20:38   DG Hand Complete Right Result Date: 08/26/2023 CLINICAL DATA:  Fall, right hand and wrist pain EXAM: RIGHT HAND - COMPLETE 3+ VIEW COMPARISON:  None Available. FINDINGS: There is no evidence of fracture or dislocation. There is no evidence of arthropathy or other focal bone abnormality. Soft  tissues are unremarkable. IMPRESSION: Negative. Electronically Signed   By: Dorethia Molt M.D.   On: 08/26/2023 20:38    Procedures Procedures (including critical care time)  Medications Ordered in UC Medications  ketorolac  (TORADOL ) 30 MG/ML injection 30 mg (30 mg Intramuscular Given 08/26/23 2031)    Initial Impression / Assessment and Plan / UC Course  I have reviewed the triage vital signs and the nursing notes.  Pertinent labs & imaging results that were available during my care of the patient were reviewed by me and considered in my medical decision making (see chart for details).   78 year old female with history of fibromyalgia, arthritis, hypertension presents for multiple injuries following accidental fall at [facility] today.  Reports pain of the right hip and difficulty bearing weight, right hand, wrist, forearm and elbow pain.  No head injury or loss of conscious.  BP elevated 174/78.  Patient does have history of hypertension.  Does not appear to be on any antihypertensive meds.  Stable with lifestyle changes.  Advised to keep a log of BP and if this low greater than 140/90 to follow-up with PCP.  Her last blood pressure check was within a normal range at 120/74 in January of this year.  Obtaining x-rays on the right hand, right wrist and right hip.  Wet reads are all negative.  Reviewed this with patient.  Advised she will be contacted if the radiologist sees any fractures.  Patient was given a wrist brace for suspected sprain of right wrist.  Reviewed RICE guidelines.  She was given 30 mg IM ketorolac  in clinic for acute pain relief and advised to continue with home medications.  Thoroughly reviewed return and ER  precautions relating to her injuries.   Final Clinical Impressions(s) / UC Diagnoses   Final diagnoses:  Fall, initial encounter  Sprain of right wrist, initial encounter  Right hand pain  Pain in joint of right hip     Discharge Instructions      - I did  not see any obvious fractures.  If the radiologist reads any fractures tomorrow I will contact you right away. - Wear the brace as needed for discomfort.  If it is broken you will need to wear it longer and follow-up with orthopedics but I will contact you tomorrow if it is broken. -If for some reason I missed a fracture in your hip we will contact you and you will need to follow-up immediately with orthopedics or in the ER. - We gave you a Toradol  injection tonight for pain relief.  Continue Lyrica and Tylenol  at home.  Ibuprofen  as needed or Aleve.     ED Prescriptions   None    PDMP not reviewed this encounter.   Arvis Jolan NOVAK, PA-C 08/27/23 [phone]

## 2023-10-04 ENCOUNTER — Ambulatory Visit: Attending: Family Medicine

## 2023-10-04 DIAGNOSIS — R1310 Dysphagia, unspecified: Secondary | ICD-10-CM | POA: Diagnosis present

## 2023-10-04 NOTE — Therapy (Signed)
 OUTPATIENT SPEECH LANGUAGE PATHOLOGY  SWALLOW EVALUATION   Patient Name: Tamara Lawrence MRN: [id number] DOB:27-Mar-1945, 78 y.o., female Today's Date: 10/04/2023  PCP: Jeffie, MD REFERRING PROVIDER: as above   End of Session - 10/04/23 1051     Visit Number 1    Number of Visits 8    Date for SLP Re-Evaluation 11/29/23    SLP Start Time 1015    SLP Stop Time  1045    SLP Time Calculation (min) 30 min    Activity Tolerance Patient tolerated treatment well          Past Medical History:  Diagnosis Date   Arthritis    Fibromyalgia    GERD (gastroesophageal reflux disease)    Hypertension    Irritable bowel syndrome (IBS)    Past Surgical History:  Procedure Laterality Date   CATARACT EXTRACTION W/PHACO Right 10/06/2020   Procedure: CATARACT EXTRACTION PHACO AND INTRAOCULAR LENS PLACEMENT (IOC) RIGHT 4.29 00:29.1;  Surgeon: Myrna Adine Anes, MD;  Location: [facility];  Service: Ophthalmology;  Laterality: Right;   CATARACT EXTRACTION W/PHACO Left 10/20/2020   Procedure: CATARACT EXTRACTION PHACO AND INTRAOCULAR LENS PLACEMENT (IOC) LEFT;  Surgeon: Myrna Adine Anes, MD;  Location: [facility];  Service: Ophthalmology;  Laterality: Left;  4.45 00:33.0   CHOLECYSTECTOMY N/A 01/10/2018   Procedure: LAPAROSCOPIC CHOLECYSTECTOMY;  Surgeon: Tye Millet, DO;  Location: [facility] OR[facility];  Service: General;  Laterality: N/A;   COLONOSCOPY WITH PROPOFOL  N/A 10/18/2019   Procedure: COLONOSCOPY WITH PROPOFOL ;  Surgeon: Unk Corinn Skiff, MD;  Location: [facility] END[facility];  Service: Gastroenterology;  Laterality: N/A;   ESOPHAGOGASTRODUODENOSCOPY (EGD) WITH PROPOFOL  N/A 10/18/2019   Procedure: ESOPHAGOGASTRODUODENOSCOPY (EGD) WITH PROPOFOL ;  Surgeon: Unk Corinn Skiff, MD;  Location: [facility];  Service: Gastroenterology;  Laterality: N/A;   TONSILLECTOMY  1956   Patient Active Problem List   Diagnosis Date Noted   Chronic GERD 03/28/2019   Incomplete tear of  left rotator cuff 06/22/2017   Rotator cuff tendinitis, left 06/01/2017   Degenerative tear of medial meniscus of left knee 05/18/2017   Obesity (BMI 30.0-34.9) 04/20/2017   Chronic shoulder bursitis, left 06/17/2016   Elevated rheumatoid factor 05/10/2016   Multiple joint pain 05/10/2016   Seasonal allergic rhinitis 03/18/2014    ONSET DATE: 09/20/23 (referral date)  REFERRING DIAG: dysphagia  THERAPY DIAG:  Dysphagia, unspecified type  Rationale for Evaluation and Treatment Rehabilitation  SUBJECTIVE:   SUBJECTIVE STATEMENT: Pt alert, pleasant, and cooperative. Pt accompanied by: significant other  PERTINENT HISTORY: Pt is a 78 y.o. female who presents today for dysphagia evaluation. Pt with PMHx depression, fibromyalgia, seasonal allergic rhinitis, diverticulitis, elevated BP, hyperlipidemia, elevated rheumatoid factor, essential HTN, GERD, and obesity. Per MD note, 09/20/23,  Her reflux has been generally well-controlled and she will note rare breakthrough when she has lapses in her diet. She has been noting some catches in her swallowing that will occur a couple of times a week. She notes it will happen with liquids or solids. She states it feels like something is stuck for a little bit and then goes down. She denies early satiety.   PAIN:  Are you having pain? No; FACES  FALLS: Has patient fallen in last 6 months?  No  LIVING ENVIRONMENT: Lives with: lives with their partner Lives in: House/apartment  PLOF:  Level of assistance: Independent with ADLs Employment: Retired   PATIENT GOALS  to have swallowing assessed  OBJECTIVE:   COGNITION: Overall cognitive status: WFL for tasks assessed  ORAL MOTOR EXAMINATION WFL  CLINICAL SWALLOW ASSESSMENT:   Current diet: regular, thin Dentition: adequate natural dentition Feeding: able to feed self Consistencies tested: Thin Liquid: Presentation: Straw and Self-fed Puree: Presentation: Spoon and Self-fed Regular:  Presentation: Self-fed   Evaluation findings: Patient presents with oropharyngeal swallow which appears clinically to be within functional limits with adequate airway protection. Oral stage is characterized by appearance of adequate oral containment, mastication, bolus formation, oral transfer and oral clearance. Swallow initiation appears timely. No overt signs of aspiration observed despite challenging with consecutive straw sips of thin liquids in excess of 3oz.  Aspiration risk factors:History of GERD, GI issues (GERD, diverticulitis) Overall aspiration risk:Mild Diet Recommendations: regular, thin Precautions:Seated upright 90 degrees, Remain upright for at least 30 minutes after meals, and Follow solids with liquid Supervision: Patient able to feed self Oral care recommendations:Oral care BID and Patient independent with oral care Follow-up recommendations: MBSS     PATIENT REPORTED OUTCOME MEASURES (PROM):  EATING ASSESSMENT TOOL (EAT-10)   The patient was asked to rate to what extent the following statements are problematic on a scale of 0-4. 0 = No problem; 4 = Severe problem. A total score of 3 or higher is considered abnormal.  1.) My swallowing problem has caused me to lose weight. 0 2.) My swallowing problem interferes with my ability to go out for meals. 0 3.) Swallowing liquids takes extra effort. 2 4.) Swallowing solids takes extra effort. 0  5.) Swallowing pills takes extra effort. 3 6.) Swallowing is painful. 0 7.) The pleasure of eating is affected by my swallowing. 1 8.) When I swallow food sticks in my throat. 0 9.) I cough when I eat. 0 10.) Swallowing is stressful. 0   TOTAL SCORE: 6/40  Reflux Symptom Index These are statements many people have used to describe their voices and the effects of their voices on their lives. In the last  month, how did the following problems affect you?   Hoarseness or a problem with your voice 1=Very mild problem  Clearing  your throat 2=Moderate or slight problem  Excess throat mucous or postnasal drip 4=Severe problem  Difficulty swallowing food, liquids, or pills 2=Moderate or slight problem  Coughing after you eat or after lying down 0=No problem  Breathing difficulties or choking episodes 0=No problem  Troublesome or annoying cough 0=No problem  Sensation of something sticking in your throat or a lump in your throat 3+Moderate problem  Heartburn, chest pain, indigestion, or stomach acid coming up 1=Very mild problem   Total: 14   Normative data suggests that an RSI of greater than or equal to 13 is clinically significant  Therefore, an RSI > 13 may be indicative of significant reflux disease.    TODAY'S TREATMENT:  Pt and significant other educated re: role of SLP, phases of swallowing, results of assessment/PROMs, rationale for MBSS, diet recommendations, safe swallowing/reflux precautions, and SLP POC.    PATIENT EDUCATION: Education details: as above Person educated: Patient and significant other Education method: Explanation Education comprehension: verbalized understanding    GOALS: Goals reviewed with patient? Yes  SHORT TERM GOALS: Target date: 10 sessions  Pt will participate in MBSS to instrumentally evaluate pt's swallow function and to assist in further goal setting. Baseline: Goal status: INITIAL   LONG TERM GOALS: Target date: 8 weeks  Pt will recall safe swallowing/reflux precautions with no more than min cues. Baseline:  Goal status: INITIAL   ASSESSMENT:  CLINICAL IMPRESSION: Patient is a 79 y.o.  female who was seen today for swallowing evaluation. Pt with reports of globus sensation throughout the day with and without PO. When asked to localize globus sensation , pt points pharyngeally. Pt demonstrated intact oropharyngeal swallow function per clinical assessment; however, given pt's subjective reports and results of PROMs (EAT-10 and RSI) will proceed with MBSS. Order  faxed to referring provider to request MBSS. Recommend completion of MBSS with swallowing goals to be added, as appropriate, pending completion.   OBJECTIVE IMPAIRMENTS include dysphagia. These impairments are limiting patient from safety when swallowing. Factors affecting potential to achieve goals and functional outcome are hx of GERD. Patient will benefit from skilled SLP services to address above impairments and improve overall function.  REHAB POTENTIAL: Good  PLAN: SLP FREQUENCY: 1x/week  SLP DURATION: 8 weeks *pending results of MBSS  PLANNED INTERVENTIONS: Diet toleration management , SLP instruction and feedback, and Patient/family education   Delon Bangs, M.S., CCC-SLP Speech-Language Pathologist [facility] [id number] [place])   [facility] at [facility] [address] [place], [place], [address] Phone: [phone]   Fax:  [phone]

## 2023-10-05 ENCOUNTER — Other Ambulatory Visit: Payer: Self-pay | Admitting: Family Medicine

## 2023-10-05 DIAGNOSIS — R1310 Dysphagia, unspecified: Secondary | ICD-10-CM

## 2023-10-05 DIAGNOSIS — K219 Gastro-esophageal reflux disease without esophagitis: Secondary | ICD-10-CM

## 2023-10-11 ENCOUNTER — Encounter

## 2023-10-13 ENCOUNTER — Encounter

## 2023-10-13 ENCOUNTER — Ambulatory Visit

## 2023-10-14 IMAGING — MR MR LUMBAR SPINE W/O CM
4 of 5 series · 25 of 48 positions shown · non-contrast
Comparison: None Available.

CLINICAL DATA: Low back pain radiating down the left leg for the
past 2 months. No injury or prior surgery.

EXAM:
MRI LUMBAR SPINE WITHOUT CONTRAST
TECHNIQUE: Multiplanar, multisequence MR imaging of the lumbar spine was
performed. No intravenous contrast was administered.

[Series 3: T2 · sagittal · 4.0mm · 0.81mm/px · 6 of 15 slices shown (1 of 2)]
[im 1/15]
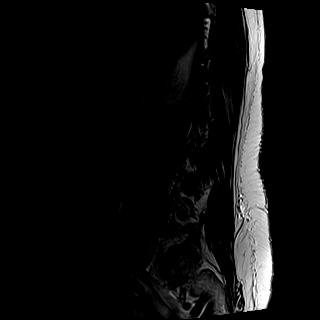
[im 3/15]
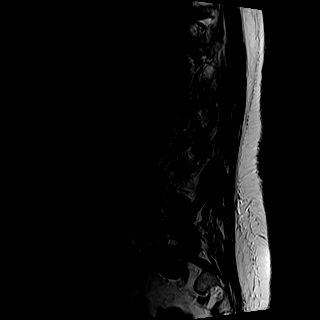
[im 6/15]
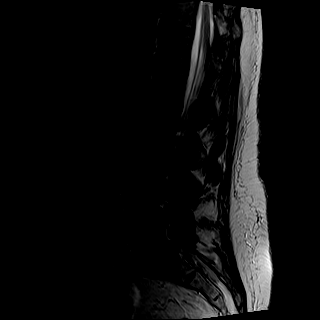
[im 9/15]
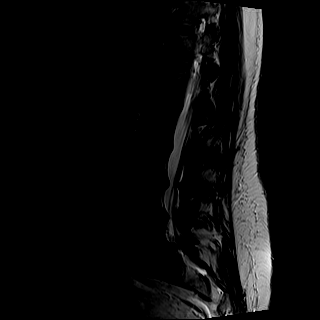
[im 12/15]
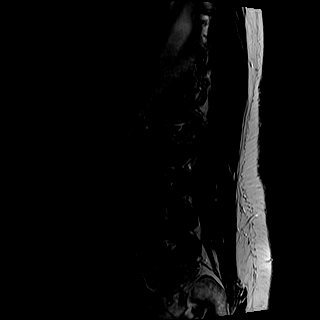
[im 15/15]
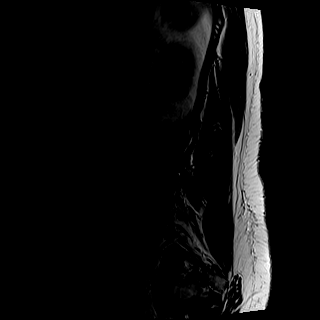

[Series 4: T1 · sagittal · 4.0mm · 0.41mm/px · 6 of 15 slices shown (1 of 2)]
[im 1/15]
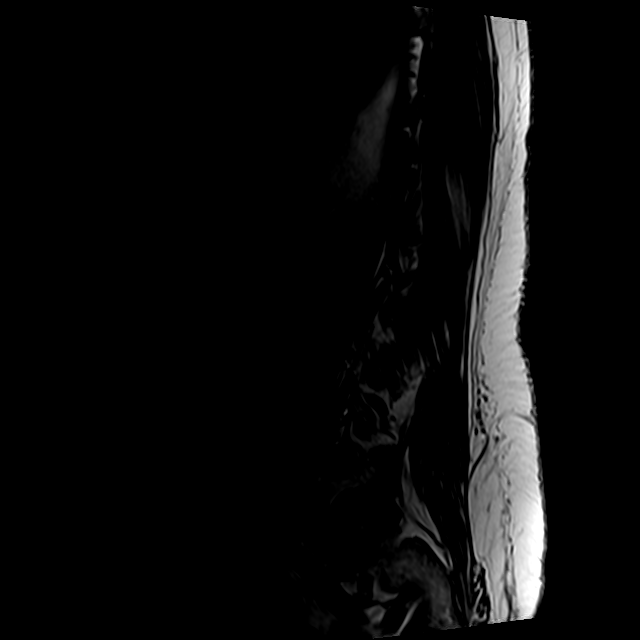
[im 3/15]
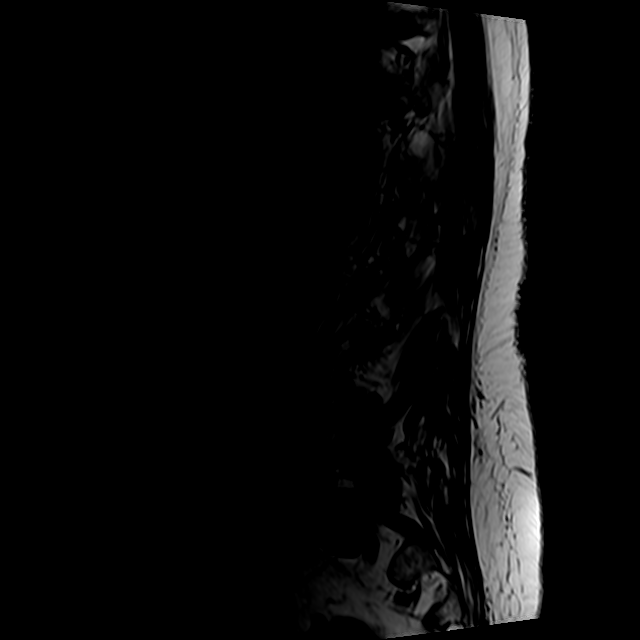
[im 6/15]
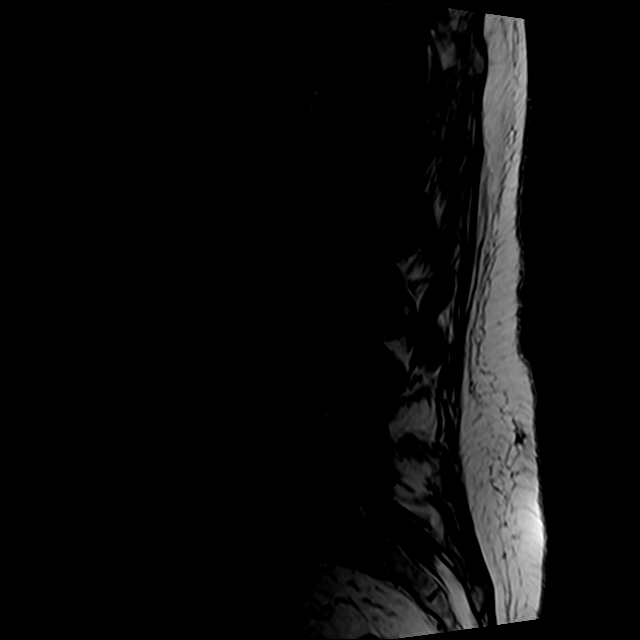
[im 9/15]
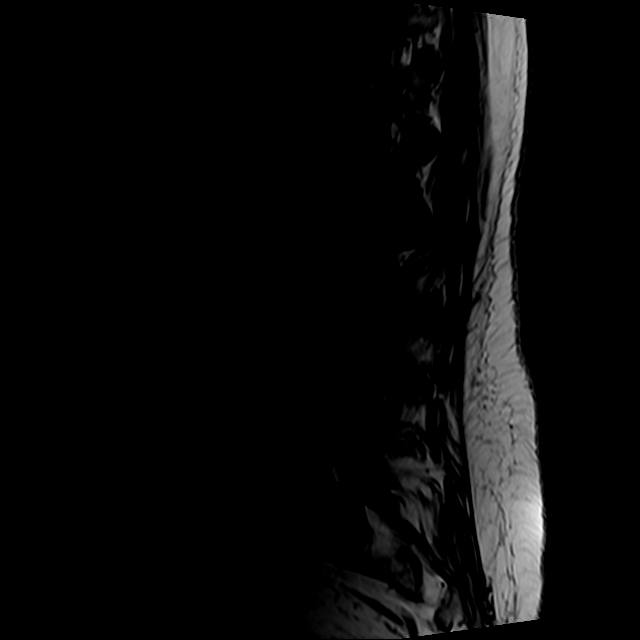
[im 12/15]
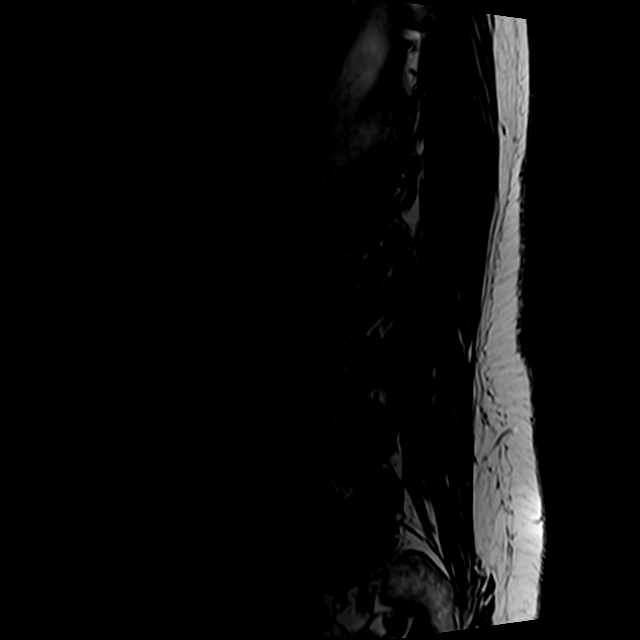
[im 15/15]
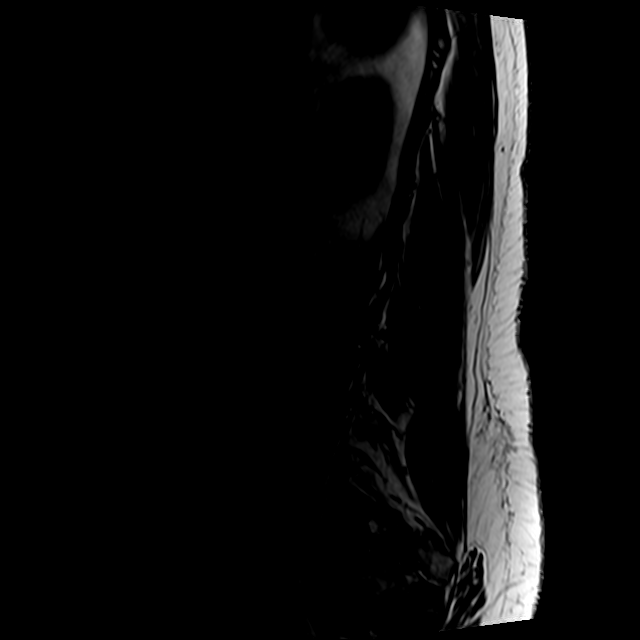

[Series 6: T2 · axial · 4.0mm · 0.78mm/px · z∈[-25,+185]mm · 9 of 38 slices shown (2 of 2)]
[im 1/38]
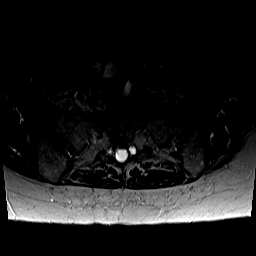
[im 6/38]
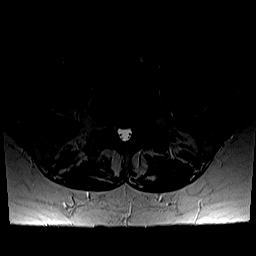
[im 11/38]
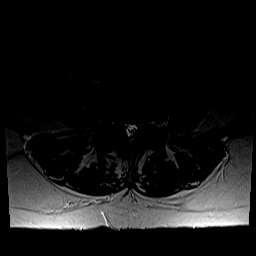
[im 16/38]
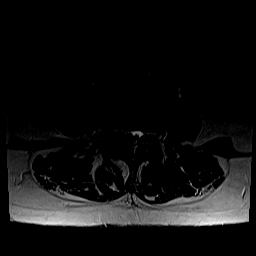
[im 19/38]
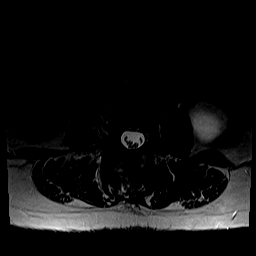
[im 22/38]
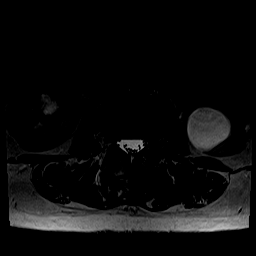
[im 27/38]
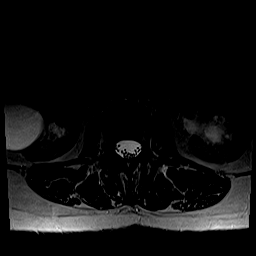
[im 32/38]
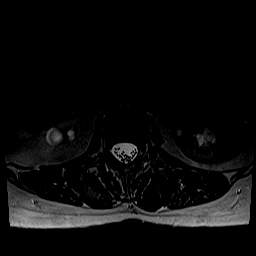
[im 38/38]
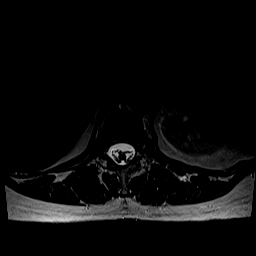

[Series 7: T1 · axial · 4.0mm · 0.39mm/px · z∈[-25,+155]mm · 4 of 38 slices shown (2 of 2)]
[im 1/38]
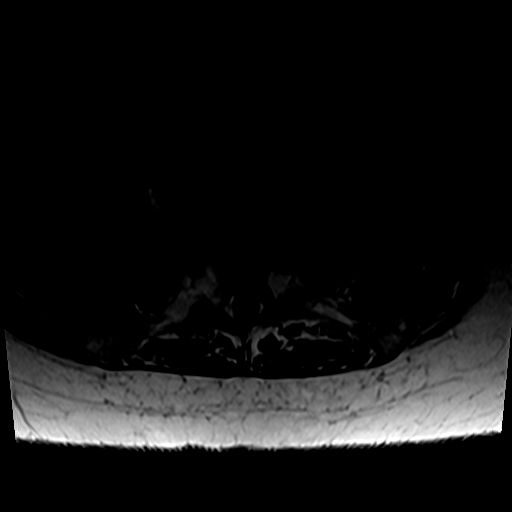
[im 6/38]
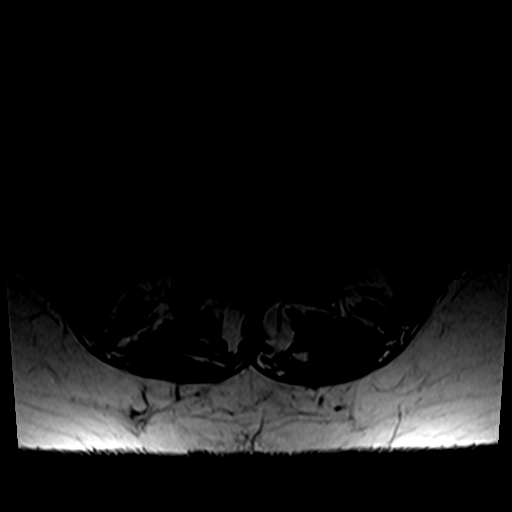
[im 19/38]
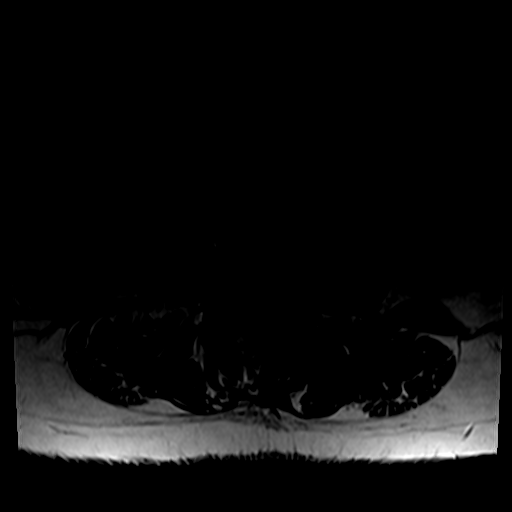
[im 32/38]
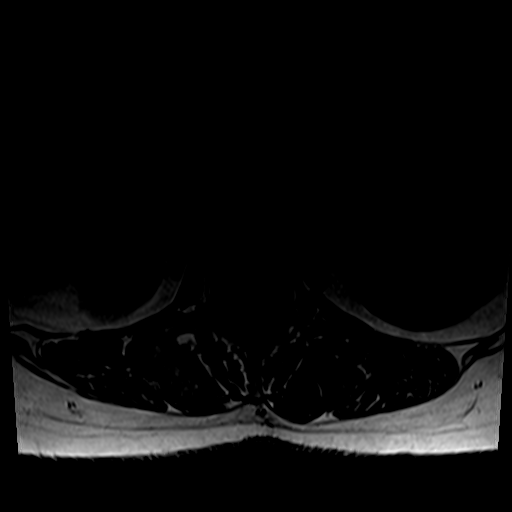

[25 of 48 positions shown; findings below may reference images not displayed]

FINDINGS: Segmentation: Assumed standard. The last well-formed disc space is
designated L5-S1 for the purposes of this report.

Alignment: Trace retrolisthesis at L3-L4. Trace anterolisthesis at
L5-S1.

Vertebrae:  No fracture, evidence of discitis, or bone lesion.

Conus medullaris and cauda equina: Conus extends to the T12-L1
level. Conus and cauda equina appear normal.

Paraspinal and other soft tissues: Bilateral renal simple cysts. No
follow-up imaging is recommended.

Disc levels:

T12-L1:  Negative.

L1-L2:  Minimal disc bulging.  No stenosis.

L2-L3: Mild disc bulging and right facet arthropathy. Mild right
neuroforaminal stenosis. No spinal canal or left neuroforaminal
stenosis.

L3-L4: Moderate disc bulging and mild bilateral facet arthropathy.
Mild spinal canal stenosis. Mild-to-moderate right and mild left
lateral recess stenosis. No neuroforaminal stenosis.

L4-L5: Mild disc bulging eccentric to the right with superimposed
tiny right foraminal disc extrusion migrating superiorly. Mild
bilateral facet arthropathy. Mild spinal canal stenosis. Moderate
right and mild left lateral recess stenosis. Mild to moderate right
neuroforaminal stenosis. No left neuroforaminal stenosis.

L5-S1: Mild disc bulging with superimposed small left subarticular
disc protrusion and annular fissure. Moderate bilateral facet
arthropathy. Mild bilateral lateral recess stenosis. Mild left
neuroforaminal stenosis. No spinal canal or right neuroforaminal
stenosis.
IMPRESSION: 1. Multilevel degenerative changes of the lumbar spine as described
above. Small left subarticular disc protrusion and annular fissure
at L5-S[DATE] irritate the descending left S1 nerve root.

## 2023-10-18 ENCOUNTER — Encounter

## 2023-10-20 ENCOUNTER — Encounter

## 2023-10-25 ENCOUNTER — Encounter

## 2023-10-27 ENCOUNTER — Encounter

## 2023-10-27 ENCOUNTER — Ambulatory Visit
Admission: RE | Admit: 2023-10-27 | Discharge: 2023-10-27 | Disposition: A | Discharge status: Home / Self Care | Source: Ambulatory Visit | Attending: Family Medicine | Admitting: Family Medicine

## 2023-10-27 DIAGNOSIS — R1310 Dysphagia, unspecified: Secondary | ICD-10-CM | POA: Insufficient documentation

## 2023-10-27 DIAGNOSIS — K219 Gastro-esophageal reflux disease without esophagitis: Secondary | ICD-10-CM | POA: Insufficient documentation

## 2023-10-27 NOTE — Therapy (Signed)
 Modified Barium Swallow Study  Patient Details  Name: Tamara Lawrence MRN: [id number] Date of Birth: 11-03-1945  Today's Date: 10/27/2023  Modified Barium Swallow completed.  Full report located under Chart Review in the Imaging Section.  History of Present Illness Pt is a 78 y.o. female who presents today for MBSS. Pt with PMHx depression, fibromyalgia, seasonal allergic rhinitis, diverticulitis, elevated BP, hyperlipidemia, elevated rheumatoid factor, essential HTN, GERD, and obesity. Per MD note, 09/20/23,  Her reflux has been generally well-controlled and she will note rare breakthrough when she has lapses in her diet. She has been noting some catches in her swallowing that will occur a couple of times a week. She notes it will happen with liquids or solids. She states it feels like something is stuck for a little bit and then goes down. She denies early satiety.   Clinical Impression Pt demonstrated oropharyngeal swallow function which is [facility] for age. Given pt's complaints of intermittent globus and a round and full area on R side of neck in setting GERD, pt may benefit from further GI work up. Recommend continuation of a regular diet with thin liquids with safe swallowing strategies / reflux precautions as outlined below. No f/u ST services recommended as education was completed during this session. . Factors that may increase risk of adverse event in presence of aspiration Noe & Lianne 2021): Respiratory or GI disease  Swallow Evaluation Recommendations Recommendations: PO diet PO Diet Recommendation: Regular;Thin liquids (Level 0) Liquid Administration via: Spoon;Cup;Straw Medication Administration: Whole meds with liquid (or as tolerated; consider a carrier for larger pills) Supervision: Patient able to self-feed Swallowing strategies  : Slow rate;Small bites/sips;Follow solids with liquids Postural changes: Position pt fully upright for meals;Out of bed for meals  (Upright >90 minutes after meals) Oral care recommendations: Oral care BID (2x/day) Recommended consults: Consider GI consultation;Consider esophageal assessment  Treatment: Pt educated at length re: MBSS procedure, results of assessment, reflux precautions, and SLP POC. Pt shown videofluoroscopic images for reinforcement of content. Handout provided with reflux precautions. Pt verbalized understanding/agreement with education provided. Pt to be d/c'd from ST given the above as well as results of MBSS.    Plan: D/C ST services  Delon Bangs, M.S., CCC-SLP Speech-Language Pathologist [facility] [phone] (ASCOM)  Delon HERO Tom Ragsdale 10/27/2023,1:28 PM

## 2023-11-01 ENCOUNTER — Encounter

## 2023-11-03 ENCOUNTER — Encounter

## 2023-11-08 ENCOUNTER — Encounter

## 2023-11-10 ENCOUNTER — Encounter

## 2023-11-15 ENCOUNTER — Encounter

## 2023-11-17 ENCOUNTER — Encounter
